# Patient Record
Sex: Female | Born: 1980 | Race: Black or African American | Hispanic: No | Marital: Single | Smoking: Current some day smoker
Health system: Southern US, Community
[De-identification: ages and names within clinical notes are randomized; demographics above are authoritative.]

## PROBLEM LIST (undated history)

## (undated) DIAGNOSIS — I10 Essential (primary) hypertension: Secondary | ICD-10-CM

## (undated) DIAGNOSIS — T7840XA Allergy, unspecified, initial encounter: Secondary | ICD-10-CM

## (undated) DIAGNOSIS — R87619 Unspecified abnormal cytological findings in specimens from cervix uteri: Secondary | ICD-10-CM

## (undated) DIAGNOSIS — K219 Gastro-esophageal reflux disease without esophagitis: Secondary | ICD-10-CM

## (undated) DIAGNOSIS — E7800 Pure hypercholesterolemia, unspecified: Secondary | ICD-10-CM

## (undated) DIAGNOSIS — R7302 Impaired glucose tolerance (oral): Secondary | ICD-10-CM

## (undated) DIAGNOSIS — J449 Chronic obstructive pulmonary disease, unspecified: Secondary | ICD-10-CM

## (undated) DIAGNOSIS — E559 Vitamin D deficiency, unspecified: Secondary | ICD-10-CM

## (undated) DIAGNOSIS — F32A Depression, unspecified: Secondary | ICD-10-CM

## (undated) DIAGNOSIS — E079 Disorder of thyroid, unspecified: Secondary | ICD-10-CM

## (undated) DIAGNOSIS — R7303 Prediabetes: Secondary | ICD-10-CM

## (undated) HISTORY — DX: Chronic obstructive pulmonary disease, unspecified: J44.9

## (undated) HISTORY — DX: Impaired glucose tolerance (oral): R73.02

## (undated) HISTORY — DX: Depression, unspecified: F32.A

## (undated) HISTORY — DX: Vitamin D deficiency, unspecified: E55.9

## (undated) HISTORY — DX: Allergy, unspecified, initial encounter: T78.40XA

## (undated) HISTORY — DX: Essential (primary) hypertension: I10

## (undated) HISTORY — DX: Gastro-esophageal reflux disease without esophagitis: K21.9

---
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
## 2000-04-20 ENCOUNTER — Emergency Department (HOSPITAL_COMMUNITY): Admission: EM | Admit: 2000-04-20 | Discharge: 2000-04-21 | Payer: Self-pay | Admitting: Emergency Medicine

## 2000-05-22 ENCOUNTER — Encounter: Admission: RE | Admit: 2000-05-22 | Discharge: 2000-05-22 | Payer: Self-pay | Admitting: Family Medicine

## 2000-06-09 ENCOUNTER — Emergency Department (HOSPITAL_COMMUNITY): Admission: EM | Admit: 2000-06-09 | Discharge: 2000-06-09 | Payer: Self-pay | Admitting: Emergency Medicine

## 2000-06-09 ENCOUNTER — Encounter: Payer: Self-pay | Admitting: Emergency Medicine

## 2001-12-09 ENCOUNTER — Other Ambulatory Visit: Admission: RE | Admit: 2001-12-09 | Discharge: 2001-12-09 | Payer: Self-pay | Admitting: Gynecology

## 2002-12-08 ENCOUNTER — Other Ambulatory Visit: Admission: RE | Admit: 2002-12-08 | Discharge: 2002-12-08 | Payer: Self-pay | Admitting: Obstetrics and Gynecology

## 2005-10-27 ENCOUNTER — Emergency Department (HOSPITAL_COMMUNITY): Admission: EM | Admit: 2005-10-27 | Discharge: 2005-10-28 | Payer: Self-pay | Admitting: Emergency Medicine

## 2005-11-19 ENCOUNTER — Emergency Department (HOSPITAL_COMMUNITY): Admission: EM | Admit: 2005-11-19 | Discharge: 2005-11-19 | Payer: Self-pay | Admitting: Family Medicine

## 2006-01-11 ENCOUNTER — Emergency Department (HOSPITAL_COMMUNITY): Admission: EM | Admit: 2006-01-11 | Discharge: 2006-01-11 | Payer: Self-pay | Admitting: Emergency Medicine

## 2006-03-18 ENCOUNTER — Emergency Department (HOSPITAL_COMMUNITY): Admission: EM | Admit: 2006-03-18 | Discharge: 2006-03-18 | Payer: Self-pay | Admitting: Emergency Medicine

## 2006-04-26 ENCOUNTER — Emergency Department (HOSPITAL_COMMUNITY): Admission: EM | Admit: 2006-04-26 | Discharge: 2006-04-27 | Payer: Self-pay | Admitting: Emergency Medicine

## 2008-05-03 ENCOUNTER — Emergency Department (HOSPITAL_COMMUNITY): Admission: EM | Admit: 2008-05-03 | Discharge: 2008-05-03 | Payer: Self-pay | Admitting: Emergency Medicine

## 2008-11-27 ENCOUNTER — Emergency Department (HOSPITAL_COMMUNITY): Admission: EM | Admit: 2008-11-27 | Discharge: 2008-11-27 | Payer: Self-pay | Admitting: Emergency Medicine

## 2009-07-07 ENCOUNTER — Emergency Department (HOSPITAL_COMMUNITY): Admission: EM | Admit: 2009-07-07 | Discharge: 2009-07-07 | Payer: Self-pay | Admitting: Family Medicine

## 2009-11-22 ENCOUNTER — Emergency Department (HOSPITAL_COMMUNITY): Admission: EM | Admit: 2009-11-22 | Discharge: 2009-11-22 | Payer: Self-pay | Admitting: Emergency Medicine

## 2010-07-13 LAB — RAPID STREP SCREEN (MED CTR MEBANE ONLY): Streptococcus, Group A Screen (Direct): NEGATIVE

## 2010-07-22 LAB — POCT I-STAT, CHEM 8
BUN: 8 mg/dL (ref 6–23)
Creatinine, Ser: 0.8 mg/dL (ref 0.4–1.2)
Potassium: 3.7 mEq/L (ref 3.5–5.1)
Sodium: 140 mEq/L (ref 135–145)
TCO2: 29 mmol/L (ref 0–100)

## 2010-07-22 LAB — URINALYSIS, ROUTINE W REFLEX MICROSCOPIC
Bilirubin Urine: NEGATIVE
Glucose, UA: NEGATIVE mg/dL
Hgb urine dipstick: NEGATIVE
Ketones, ur: NEGATIVE mg/dL
Nitrite: NEGATIVE
Protein, ur: NEGATIVE mg/dL
Specific Gravity, Urine: 1.014 (ref 1.005–1.030)
Urobilinogen, UA: 0.2 mg/dL (ref 0.0–1.0)
pH: 6 (ref 5.0–8.0)

## 2010-07-22 LAB — DIFFERENTIAL
Basophils Relative: 1 % (ref 0–1)
Eosinophils Absolute: 0.2 10*3/uL (ref 0.0–0.7)
Lymphocytes Relative: 56 % — ABNORMAL HIGH (ref 12–46)
Neutro Abs: 0.8 10*3/uL — ABNORMAL LOW (ref 1.7–7.7)

## 2010-07-22 LAB — CBC
HCT: 40.7 % (ref 36.0–46.0)
Hemoglobin: 13.6 g/dL (ref 12.0–15.0)
MCHC: 33.5 g/dL (ref 30.0–36.0)
RBC: 4.9 MIL/uL (ref 3.87–5.11)

## 2011-08-19 ENCOUNTER — Encounter (HOSPITAL_COMMUNITY): Payer: Self-pay | Admitting: *Deleted

## 2011-08-19 ENCOUNTER — Emergency Department (HOSPITAL_COMMUNITY)
Admission: EM | Admit: 2011-08-19 | Discharge: 2011-08-19 | Disposition: A | Discharge status: Home / Self Care | Payer: Self-pay | Attending: Emergency Medicine | Admitting: Emergency Medicine

## 2011-08-19 DIAGNOSIS — R109 Unspecified abdominal pain: Secondary | ICD-10-CM | POA: Insufficient documentation

## 2011-08-19 DIAGNOSIS — R197 Diarrhea, unspecified: Secondary | ICD-10-CM | POA: Insufficient documentation

## 2011-08-19 DIAGNOSIS — R110 Nausea: Secondary | ICD-10-CM | POA: Insufficient documentation

## 2011-08-19 HISTORY — DX: Unspecified abnormal cytological findings in specimens from cervix uteri: R87.619

## 2011-08-19 MED ORDER — ONDANSETRON HCL 4 MG/2ML IJ SOLN
4.0000 mg | Freq: Once | INTRAMUSCULAR | Status: AC
  Administered 2011-08-19: 4 mg via INTRAVENOUS
  Filled 2011-08-19: qty 2

## 2011-08-19 MED ORDER — SODIUM CHLORIDE 0.9 % IV BOLUS (SEPSIS)
1000.0000 mL | Freq: Once | INTRAVENOUS | Status: AC
  Administered 2011-08-19: 1000 mL via INTRAVENOUS

## 2011-08-19 NOTE — ED Provider Notes (Signed)
I saw and evaluated the patient, reviewed the resident's note and I agree with the findings and plan. C/o intermittent epigastric cramps with n and diarrhea. No vomiting. sxs last only a few  Minutes.  Friend has same sxs. asx now.  Mild epigastric ttp. No  Peritoneal signs. Will give ivf and release. No indication for tests.  Cheri Guppy, MD 08/19/11 1729

## 2011-08-19 NOTE — ED Provider Notes (Signed)
History     CSN: [id number]  Arrival date & time 08/19/11  1519   None     Chief Complaint  Patient presents with  . Abdominal Pain    (Consider location/radiation/quality/duration/timing/severity/associated sxs/prior treatment) HPI Comments: Otherwise doing well.  No period x 7 months (normal for pt).  Friend with diarrhea last week - thinks she caught a virus from her.  Patient is a 31 y.o. female presenting with abdominal pain. The history is provided by the patient.  Abdominal Pain The primary symptoms of the illness include abdominal pain (upper abdominal cramping), nausea and diarrhea (8 episodes/day x 1 week  watery brown). The primary symptoms of the illness do not include fever, shortness of breath, vomiting, dysuria, vaginal discharge or vaginal bleeding. Episode onset: 7 days. The onset of the illness was gradual. The problem has not changed since onset. Symptoms associated with the illness do not include urgency, frequency or back pain.    Past Medical History  Diagnosis Date  . Pap smear abnormality of cervix     No past surgical history on file.  No family history on file.  History  Substance Use Topics  . Smoking status: Current Some Day Smoker  . Smokeless tobacco: Not on file  . Alcohol Use:     OB History    Grav Para Term Preterm Abortions TAB SAB Ect Mult Living                  Review of Systems  Constitutional: Negative for fever and activity change.  HENT: Negative for congestion.   Eyes: Negative for visual disturbance.  Respiratory: Negative for chest tightness and shortness of breath.   Cardiovascular: Negative for chest pain and leg swelling.  Gastrointestinal: Positive for nausea, abdominal pain (upper abdominal cramping) and diarrhea (8 episodes/day x 1 week  watery brown). Negative for vomiting.  Genitourinary: Negative for dysuria, urgency, frequency, vaginal bleeding and vaginal discharge.  Musculoskeletal: Negative for back pain.    Skin: Negative for rash.  Neurological: Negative for syncope.  Psychiatric/Behavioral: Negative for behavioral problems.    Allergies  Codeine and Hydrocodone  Home Medications   Current Outpatient Rx  Name Route Sig Dispense Refill  . LORATADINE-PSEUDOEPHEDRINE ER 10-240 MG PO TB24 Oral Take 1 tablet by mouth daily.      BP 108/68  Pulse 78  Temp(Src) 98.3 F (36.8 C) (Oral)  Resp 18  SpO2 99%  Physical Exam  Constitutional: She is oriented to person, place, and time. She appears well-developed and well-nourished.  HENT:  Head: Normocephalic and atraumatic.  Eyes: Conjunctivae and EOM are normal. Pupils are equal, round, and reactive to light. No scleral icterus.  Neck: Normal range of motion. Neck supple.  Cardiovascular: Normal rate and regular rhythm.  Exam reveals no gallop and no friction rub.   No murmur heard. Pulmonary/Chest: Effort normal and breath sounds normal. No respiratory distress. She has no wheezes. She has no rales. She exhibits no tenderness.  Abdominal: Soft. She exhibits no distension and no mass. There is tenderness (mild upper abdominal ttp). There is no rebound and no guarding.  Musculoskeletal: Normal range of motion.  Neurological: She is alert and oriented to person, place, and time. She has normal reflexes. No cranial nerve deficit.  Skin: Skin is warm and dry. No rash noted.  Psychiatric: She has a normal mood and affect. Her behavior is normal. Judgment and thought content normal.    ED Course  Procedures (including critical care time)  Labs Reviewed  POCT PREGNANCY, URINE   No results found.   1. Diarrhea   2. Nausea       MDM  Diarrhea x 1 week.  Otherwise doing well.  No period x 7 months (normal for pt).  Friend with diarrhea last week - thinks she caught a virus from her.  VSS and well appearing.  No S/S of dehydration.  Gave dose of zofran, NS bolus.  Pt asymptomatic in ED.  Doubt acute intraabdominal pathology.  Likely  viral diarrhea.  Gave info to establish PCP.  Pt comfortable with plan and will follow up.         Army Chaco, MD 08/19/11 1701

## 2011-08-19 NOTE — Discharge Instructions (Signed)
RESOURCE GUIDE  Dental Problems  Patients with Medicaid: [facility]                     [address]                                           Phone:  [phone]                                                  If unable to pay or uninsured, contact:  [facility] or [facility] to become qualified for the adult dental clinic.  Chronic Pain Problems Contact [facility]  [phone] Patients need to be referred by their primary care doctor.  Insufficient Money for Medicine Contact [employer]:  call "211" or [facility] 271-5999.  No Primary Care Doctor Call [facility]  832-8000 Other agencies that provide inexpensive medical care    [facility]  832-8035    [facility]  832-7272    [facility]  [phone]    [facility]  832-4777    [facility]  [phone]    [facility]  [phone]  Substance Abuse Resources [facility]  336-882-2125 [facility] 336-784-9470 [facility] 336-285-9073 [facility] 336-845-3988 Residential & Outpatient Substance Abuse Program  800-659-3381  Psychological Services [facility]  832-9600 [facility]  [phone] [facility]   800 853-5163 (emergency services 641-4993)  Abuse/Neglect [place] Child [facility] Hotline (336) 641-3795 [place] [facility] Hotline 800-378-5315 (After Hours)  Emergency Shelter [facility] (336) 271-5985  [facility] (336) 275-9566 [facility] (704) 372-4663  MRSA Hotline #:   832-7006    [place] Resources  [facility] [facility]  [employer]                           [facility] [address] [place]                     [address]         [address]  [place]                                                [place]                              [place] Phone:  [phone]                                  Phone:  [phone]                   Phone:  [phone]  [facility] Phone:  342-8316  [place] Child Abuse Hotline (336) 342-1394 (336)   612-443-6666 (After Hours)Diarrhea Infections caused by germs (bacterial) or a virus commonly cause diarrhea. Your caregiver has determined that with time, rest and fluids, the diarrhea should improve. In general, eat normally while drinking more water than usual. Although water may prevent dehydration, it does not contain salt and minerals (electrolytes). Broths, weak tea without caffeine and oral rehydration solutions (ORS) replace fluids and electrolytes. Small amounts of fluids should be taken frequently. Large amounts at one time may not be tolerated. Plain water may be harmful in infants and the elderly. Oral rehydrating solutions (ORS) are available at pharmacies and grocery stores. ORS replace water and important electrolytes in proper proportions. Sports drinks are not as effective as ORS and may be harmful due to sugars worsening diarrhea.  ORS is especially recommended for use in children with diarrhea. As a general guideline for children, replace any new fluid losses from diarrhea and/or vomiting with ORS as follows:   If your child weighs 22 pounds or under (10 kg or less), give 60-120 mL ( -  cup or 2 - 4 ounces) of ORS for each episode of diarrheal stool or vomiting episode.   If your child weighs more than 22 pounds (more than 10 kgs), give 120-240 mL ( - 1 cup or 4 - 8 ounces) of ORS for each diarrheal stool or episode of vomiting.   While correcting for dehydration, children should eat normally. However, foods high in sugar should be avoided because this may worsen diarrhea. Large amounts of carbonated soft drinks, juice, gelatin desserts and other highly sugared drinks should be avoided.   After  correction of dehydration, other liquids that are appealing to the child may be added. Children should drink small amounts of fluids frequently and fluids should be increased as tolerated. Children should drink enough fluids to keep urine clear or pale yellow.   Adults should eat normally while drinking more fluids than usual. Drink small amounts of fluids frequently and increase as tolerated. Drink enough fluids to keep urine clear or pale yellow. Broths, weak decaffeinated tea, lemon lime soft drinks (allowed to go flat) and ORS replace fluids and electrolytes.   Avoid:   Carbonated drinks.   Juice.   Extremely hot or cold fluids.   Caffeine drinks.   Fatty, greasy foods.   Alcohol.   Tobacco.   Too much intake of anything at one time.   Gelatin desserts.   Probiotics are active cultures of beneficial bacteria. They may lessen the amount and number of diarrheal stools in adults. Probiotics can be found in yogurt with active cultures and in supplements.   Wash hands well to avoid spreading bacteria and virus.   Anti-diarrheal medications are not recommended for infants and children.   Only take over-the-counter or prescription medicines for pain, discomfort or fever as directed by your caregiver. Do not give aspirin to children because it may cause Reye's Syndrome.   For adults, ask your caregiver if you should continue all prescribed and over-the-counter medicines.   If your caregiver has given you a follow-up appointment, it is very important to keep that appointment. Not keeping the appointment could result in a chronic or permanent injury, and disability. If there is any problem keeping the appointment, you must call back to this facility for assistance.  SEEK IMMEDIATE MEDICAL CARE IF:   You or your child is unable to keep fluids down or other symptoms or problems become worse in spite of treatment.   Vomiting or diarrhea develops and  becomes persistent.   There is  vomiting of blood or bile (green material).   There is blood in the stool or the stools are black and tarry.   There is no urine output in 6-8 hours or there is only a small amount of very dark urine.   Abdominal pain develops, increases or localizes.   You have a fever.   Your baby is older than 3 months with a rectal temperature of 102 F (38.9 C) or higher.   Your baby is 38 months old or younger with a rectal temperature of 100.4 F (38 C) or higher.   You or your child develops excessive weakness, dizziness, fainting or extreme thirst.   You or your child develops a rash, stiff neck, severe headache or become irritable or sleepy and difficult to awaken.  MAKE SURE YOU:   Understand these instructions.   Will watch your condition.   Will get help right away if you are not doing well or get worse.  Document Released: 03/14/2002 Document Revised: 03/13/2011 Document Reviewed: 01/29/2009 [facility] Patient Information 2012 [place], [place].

## 2011-08-19 NOTE — ED Notes (Signed)
Onset: 08/16/11; no vomiting, having diarrhea - lt. Manson Passey. No otc. Exp. Cramps.

## 2011-08-19 NOTE — ED Provider Notes (Signed)
I saw and evaluated the patient, reviewed the resident's note and I agree with the findings and plan.  Cheri Guppy, MD 08/19/11 [phone]

## 2011-08-19 NOTE — ED Notes (Signed)
NAD noted at time of d/c home. D/C inst given by MD, pt verbalized understanding.

## 2013-05-16 ENCOUNTER — Encounter (HOSPITAL_COMMUNITY): Payer: Self-pay | Admitting: Emergency Medicine

## 2013-05-16 ENCOUNTER — Emergency Department (HOSPITAL_COMMUNITY)
Admission: EM | Admit: 2013-05-16 | Discharge: 2013-05-16 | Disposition: A | Discharge status: Home / Self Care | Payer: Self-pay | Attending: Emergency Medicine | Admitting: Emergency Medicine

## 2013-05-16 ENCOUNTER — Emergency Department (HOSPITAL_COMMUNITY): Payer: Self-pay

## 2013-05-16 DIAGNOSIS — F17200 Nicotine dependence, unspecified, uncomplicated: Secondary | ICD-10-CM | POA: Insufficient documentation

## 2013-05-16 DIAGNOSIS — R6889 Other general symptoms and signs: Secondary | ICD-10-CM

## 2013-05-16 DIAGNOSIS — J069 Acute upper respiratory infection, unspecified: Secondary | ICD-10-CM | POA: Insufficient documentation

## 2013-05-16 DIAGNOSIS — J111 Influenza due to unidentified influenza virus with other respiratory manifestations: Secondary | ICD-10-CM | POA: Insufficient documentation

## 2013-05-16 DIAGNOSIS — Z3202 Encounter for pregnancy test, result negative: Secondary | ICD-10-CM | POA: Insufficient documentation

## 2013-05-16 LAB — URINALYSIS, ROUTINE W REFLEX MICROSCOPIC
Bilirubin Urine: NEGATIVE
GLUCOSE, UA: NEGATIVE mg/dL
KETONES UR: NEGATIVE mg/dL
LEUKOCYTES UA: NEGATIVE
Nitrite: NEGATIVE
PH: 5 (ref 5.0–8.0)
Protein, ur: NEGATIVE mg/dL
Specific Gravity, Urine: 1.026 (ref 1.005–1.030)
Urobilinogen, UA: 0.2 mg/dL (ref 0.0–1.0)

## 2013-05-16 LAB — CBC WITH DIFFERENTIAL/PLATELET
BASOS ABS: 0 10*3/uL (ref 0.0–0.1)
BASOS PCT: 0 % (ref 0–1)
EOS ABS: 0.2 10*3/uL (ref 0.0–0.7)
Eosinophils Relative: 3 % (ref 0–5)
HCT: 42.6 % (ref 36.0–46.0)
HEMOGLOBIN: 14.4 g/dL (ref 12.0–15.0)
Lymphocytes Relative: 38 % (ref 12–46)
Lymphs Abs: 2 10*3/uL (ref 0.7–4.0)
MCH: 28.7 pg (ref 26.0–34.0)
MCHC: 33.8 g/dL (ref 30.0–36.0)
MCV: 84.9 fL (ref 78.0–100.0)
Monocytes Absolute: 0.9 10*3/uL (ref 0.1–1.0)
Monocytes Relative: 16 % — ABNORMAL HIGH (ref 3–12)
NEUTROS ABS: 2.3 10*3/uL (ref 1.7–7.7)
NEUTROS PCT: 43 % (ref 43–77)
PLATELETS: 249 10*3/uL (ref 150–400)
RBC: 5.02 MIL/uL (ref 3.87–5.11)
RDW: 13.1 % (ref 11.5–15.5)
WBC: 5.3 10*3/uL (ref 4.0–10.5)

## 2013-05-16 LAB — URINE MICROSCOPIC-ADD ON

## 2013-05-16 LAB — COMPREHENSIVE METABOLIC PANEL
ALBUMIN: 3.8 g/dL (ref 3.5–5.2)
ALK PHOS: 70 U/L (ref 39–117)
ALT: 17 U/L (ref 0–35)
AST: 21 U/L (ref 0–37)
BUN: 12 mg/dL (ref 6–23)
CHLORIDE: 99 meq/L (ref 96–112)
CO2: 24 mEq/L (ref 19–32)
Calcium: 9.3 mg/dL (ref 8.4–10.5)
Creatinine, Ser: 0.74 mg/dL (ref 0.50–1.10)
GFR calc Af Amer: >90 mL/min (ref >90)
GFR calc non Af Amer: >90 mL/min (ref >90)
Glucose, Bld: 93 mg/dL (ref 70–99)
POTASSIUM: 4.1 meq/L (ref 3.7–5.3)
SODIUM: 139 meq/L (ref 137–147)
TOTAL PROTEIN: 8.1 g/dL (ref 6.0–8.3)

## 2013-05-16 LAB — POCT PREGNANCY, URINE: PREG TEST UR: NEGATIVE

## 2013-05-16 MED ORDER — ALBUTEROL SULFATE HFA 108 (90 BASE) MCG/ACT IN AERS
4.0000 | INHALATION_SPRAY | Freq: Once | RESPIRATORY_TRACT | Status: AC
  Administered 2013-05-16: 4 via RESPIRATORY_TRACT
  Filled 2013-05-16: qty 6.7

## 2013-05-16 MED ORDER — IBUPROFEN 600 MG PO TABS: 600.0000 mg | ORAL_TABLET | Freq: Four times a day (QID) | ORAL | Status: DC | PRN

## 2013-05-16 MED ORDER — ACETAMINOPHEN 500 MG PO TABS
1000.0000 mg | ORAL_TABLET | Freq: Once | ORAL | Status: AC
  Administered 2013-05-16: 1000 mg via ORAL
  Filled 2013-05-16: qty 2

## 2013-05-16 MED ORDER — IBUPROFEN 800 MG PO TABS: 800.0000 mg | ORAL_TABLET | Freq: Once | ORAL | Status: DC

## 2013-05-16 NOTE — Discharge Instructions (Signed)
Follow up with a primary care doctor in 1 week. Resource guide provided for follow up. Use inhaler as directed. Return to ED should you develop worsening symptoms.    Emergency Department Resource Guide 1) Find a Doctor and Pay Out of Pocket Although you won't have to find out who is covered by your insurance plan, it is a good idea to ask around and get recommendations. You will then need to call the office and see if the doctor you have chosen will accept you as a new patient and what types of options they offer for patients who are self-pay. Some doctors offer discounts or will set up payment plans for their patients who do not have insurance, but you will need to ask so you aren't surprised when you get to your appointment.  2) Contact Your Local Health Department Not all health departments have doctors that can see patients for sick visits, but many do, so it is worth a call to see if yours does. If you don't know where your local health department is, you can check in your phone book. The CDC also has a tool to help you locate your state's health department, and many state websites also have listings of all of their local health departments.  3) Find a Walk-in Clinic If your illness is not likely to be very severe or complicated, you may want to try a walk in clinic. These are popping up all over the country in pharmacies, drugstores, and shopping centers. They're usually staffed by nurse practitioners or physician assistants that have been trained to treat common illnesses and complaints. They're usually fairly quick and inexpensive. However, if you have serious medical issues or chronic medical problems, these are probably not your best option.  No Primary Care Doctor: - Call Health Connect at  [phone] - they can help you locate a primary care doctor that  accepts your insurance, provides certain services, etc. - Physician Referral Service- [phone]  Chronic Pain  Problems: Organization         Address  Phone   Notes  [facility]  [phone] Patients need to be referred by their primary care doctor.   Medication Assistance: Organization         Address  Phone   Notes  [facility] [address] [place], [place] [address] [phone] --Must be a resident of [facility] -- Must have NO insurance coverage whatsoever (no Medicaid/ Medicare, etc.) -- The pt. MUST have a primary care doctor that directs their care regularly and follows them in the community   MedAssist  [phone]   [employer]  [phone]    Agencies that provide inexpensive medical care: Organization         Address  Phone   Notes  [facility]  [phone]   [facility]    [phone]   [facility] [address] [place], [place] [address] [phone]   [facility] [address], [place] [phone]   [facility]anned [facility]    [phone]   [facility]    [phone]   [facility]  [address], [place] Phone:  [phone], Fax:  [phone] Hours of Operation:  9 am - 6 pm, M-F.  Also accepts Medicaid/Medicare and self-pay.  [facility]  [address]  [place], [place], [place] Phone: [phone], Fax: [phone]. Hours of Operation:  8:30 am - 5:30 pm, M-F.  Also accepts Medicaid and self-pay.  [facility] [place] [address], [place] Phone: [phone]   [place], [place], [place] [phone], Ext. [phone] Mondays & Thursdays: 7-9 AM.  First 15 patients are seen on a first come, first serve basis.    [place] Providers:  Organization         Address  Phone   Notes  [facility] [address], [place] [phone] Also  accepts self-pay patients.  [facility] [address], [place]  [phone]   [place], [address], [place] [phone]   [facility] [address] [place] [phone]   Lucianne Lei [address], [place]   [phone] Only accepts [place] Access [place] patients after they have their name applied to their card.   Self-Pay (no insurance) in [facility]:  Organization         Address  Phone   Notes  Sickle Cell Patients, [facility] [place] [phone]   [facility] [place] [phone]   [facility] [place]  [place], [place],  [phone]   [facility]/Dr. Osei-Bonsu  [address] [place] or [address], [place] [phone] Phone number for both [place] and [place] locations is the same.  [facility] [address], [place] [phone]   [facility] [address] [place] or [address] [phone]   [facility] [address], [place] [phone], phone; [phone], fax Sees patients 1st and 3rd Saturday of every month.  Must not qualify for public or private insurance (i.e. Medicaid, Medicare, [place] Health Choice, Veterans' Benefits)  Household income should be no more than 200% of the poverty level The clinic cannot treat you if you are pregnant or think you are pregnant  Sexually transmitted diseases are not treated at the clinic.    Dental Care: Organization         Address  Phone  Notes  [facility] [place] [phone] Accepts children up to age 34 who are enrolled in [place] or [place]; pregnant  women with a Medicaid card; and children who have applied for Medicaid or [place] Health Choice, but were declined, whose parents can pay a reduced fee at time of service.  [facility]  [address], [place] [phone] Accepts children up to age 24 who are enrolled in [place] or [place]; pregnant women with a Medicaid card; and children who have applied for Medicaid or [place] Health Choice, but were declined, whose parents can pay a reduced fee at time of service.  [facility]  [place] [phone] Patients are seen by appointment only. Walk-ins are not accepted. [place] will see patients 57 years of age and older. Monday - Tuesday (8am-5pm) Most Wednesdays (8:30-5pm) $30 per visit, cash only  [place]  [address], [place] [phone] Patients are seen by appointment only. Walk-ins are not accepted. [facility] will see patients 18 years of age and older. °One Wednesday Evening (Monthly: Volunteer Based).  $30 per visit, cash only  °[facility]  [phone] for adults; Children under age 4, call Graduate Pediatric Dentistry at [phone]. Children aged 4-14, please call [phone] to request a pediatric application. ° Dental services are provided in all areas of dental care including fillings, crowns and bridges, complete and partial dentures, implants, gum treatment, root canals, and extractions. Preventive care is also provided. Treatment is provided to both adults and children. °Patients are selected via a lottery and there is often a waiting list. °  °[facility] [address], °[place] ° [phone] [email or web address].com °  °[facility] [address], [place], [place] [phone], Ext. [phone] Second and Fourth Thursday of each month, opens at 6:30 AM; Clinic ends at 9 AM.  Patients are  seen on a first-come first-served basis, and a limited number are seen during each clinic.  ° °[facility] ° [address], [place], [place] [phone]   Eligibility Requirements °You must have lived in [place], [place], or [place] counties for at least the last three months. °  You cannot be eligible for state or federal sponsored healthcare insurance, including Veterans Administration, Medicaid, or Medicare. °  You generally cannot be eligible for healthcare insurance through your employer.  °  How to apply: °Eligibility screenings are held every Tuesday and Wednesday afternoon from 1:00 pm until 4:00 pm. You do not need an appointment for the interview!  °[facility] [address], [place], [place] [phone]   °[facility]  [phone]   °[facility]  [phone]   °[facility]  336-570-6415   ° °Behavioral Health Resources in the Community: °Intensive Outpatient Programs °Organization         Address  Phone  Notes  °[facility] [address], [place], [place] [phone]   °[facility] [address], [place], [place] [phone]   °[facility] [address], [place], [place] ° 336-882-2125   °[facility] [address],  °[place], [place] 1-800-853-5163 or 336-641-4981   °Substance Abuse Resources °Organization         Address  Phone  Notes  °[facility]  336-882-2125   °[facility]  336-784-9470   °[facility]  336-285-9073   °[facility]  336-845-3988   °Residential & Outpatient Substance Abuse Program  1-800-659-3381   °Psychological Services °Organization         Address  Phone  Notes  ° Health  336- [phone]   °[facility]  336- 378-7881   °[facility] [address], [place] 1-800-853-5163 or 336-641-4981   ° °Mobile Crisis  Teams °Organization         Address  Phone  Notes  °[facility], Mobile Crisis Care Unit  1-877-626-1772   °Assertive °Psychotherapeutic Services ° [address] [place], [place] 336-834-9664   °Sharon DeEsch [address] °[place] 336-554-5454   ° °Self-Help/Support Groups °Organization         Address  Phone             Notes  °[facility] [place] -   variety of support groups  336- 336-389-6044 Call for more information  Narcotics Anonymous (NA), [facility] [address], [employer] [place]  2 meetings at this location   Residential Facilities manager         Address  Phone  Notes  [facility] [place],    [place]  1-463-457-0038   [facility]  [address] [place] [address], [place], [place]   [place], [place] [phone] Admissions: 8am-3pm M-F  [facility] [place] [address],    [place], [place] [phone]   [facility] [address] [place], [place], [place]   [facility] [address]  [place], [place]   [facility] - Intensive Outpatient [address], [place], [place]   [facility] [place] [address]  [place], [place] 1-640-386-3300 or 325-254-7062   [facility] ([facility]) [address] [place], [place] Accepts Medicaid  [facility] [place] [address],  [place] 1-646-193-9807 Substance Abuse/Addiction Treatment   [facility] Organization         Address  Phone  Notes  [facility]  [phone]   Domenic Schwab, PhD [address] [place], [place]   (925) [phone] or ([phone]) [phone]   [facility] [place], [place] [phone]   [facility] [address] [place], [place] [phone]  Insurance/Medicaid/sponsorship through [facility] and Families [address], [address]                                    [place], [place] [phone] [place] [address], [place] [phone]    Dr. Adele Schilder  [phone]   [facility] Dept. 1) [address] [place] 2) [place] 3)  [place] [address], [place] [phone]  [phone]   [place] [phone] or [phone] (After Hours)

## 2013-05-16 NOTE — ED Provider Notes (Signed)
CSN: [id number]     Arrival date & time 05/16/13  4098 History   First MD Initiated Contact with Patient 05/16/13 1601     Chief Complaint  Patient presents with  . Generalized Body Aches  . Chills     (Consider location/radiation/quality/duration/timing/severity/associated sxs/prior Treatment) HPI 33 yo female presents with body aches, nausea, sneezing, diarrhea x 4, cough, chills, HA, weakness and fatigue that all started yesterday. Patient works as a Lawyer at [employer].  Patient admits to flu vaccine this year. Patient admits to sick contacts at work. Denies sore throat, Vomiting, bloody stools, CP, or SOB. Patient has tried benadryl with little relief.  Admits to a hx of bronchitis in the past that she had used an inhaler for but ran out.   Patient social smoker/drinker x 1 year.   Past Medical History  Diagnosis Date  . Pap smear abnormality of cervix    History reviewed. No pertinent past surgical history. No family history on file. History  Substance Use Topics  . Smoking status: Current Some Day Smoker  . Smokeless tobacco: Not on file  . Alcohol Use: Yes     Comment: occ   OB History   Grav Para Term Preterm Abortions TAB SAB Ect Mult Living                 Review of Systems  All other systems reviewed and are negative.      Allergies  Codeine; Hydrocodone; Tomato; and Pineapple  Home Medications   Current Outpatient Rx  Name  Route  Sig  Dispense  Refill  . diphenhydrAMINE (BENADRYL) 25 MG tablet   Oral   Take 25 mg by mouth every 6 (six) hours as needed for allergies.         Marland Kitchen ibuprofen (ADVIL,MOTRIN) 600 MG tablet   Oral   Take 1 tablet (600 mg total) by mouth every 6 (six) hours as needed.   30 tablet   0    BP 111/73  Pulse 83  Temp(Src) 98.3 F (36.8 C) (Oral)  Resp 18  SpO2 100%  LMP 03/21/2013 Physical Exam  Nursing note and vitals reviewed. Constitutional: She is oriented to person, place, and time. She appears well-developed  and well-nourished. No distress.  HENT:  Head: Normocephalic and atraumatic.  Right Ear: Tympanic membrane and ear canal normal.  Left Ear: Tympanic membrane and ear canal normal.  Nose: Nose normal. Right sinus exhibits no maxillary sinus tenderness and no frontal sinus tenderness. Left sinus exhibits no maxillary sinus tenderness and no frontal sinus tenderness.  Mouth/Throat: Uvula is midline, oropharynx is clear and moist and mucous membranes are normal. No oropharyngeal exudate, posterior oropharyngeal edema or posterior oropharyngeal erythema.  Eyes: Conjunctivae and EOM are normal. Right eye exhibits no discharge. Left eye exhibits no discharge. No scleral icterus.  Neck: Normal range of motion and phonation normal. Neck supple. No JVD present. No rigidity. No tracheal deviation, no edema and no erythema present.  Cardiovascular: Normal rate and regular rhythm.  Exam reveals no gallop and no friction rub.   No murmur heard. Pulmonary/Chest: Effort normal and breath sounds normal. No stridor. No respiratory distress. She has no wheezes. She has no rales.  Musculoskeletal: Normal range of motion. She exhibits no edema.  Lymphadenopathy:    She has no cervical adenopathy.  Neurological: She is alert and oriented to person, place, and time.  Skin: Skin is warm and dry. She is not diaphoretic.  Psychiatric: She has a  normal mood and affect. Her behavior is normal.    ED Course  Procedures (including critical care time) Labs Review Labs Reviewed  CBC WITH DIFFERENTIAL - Abnormal; Notable for the following:    Monocytes Relative 16 (*)    All other components within normal limits  COMPREHENSIVE METABOLIC PANEL - Abnormal; Notable for the following:    Total Bilirubin <0.2 (*)    All other components within normal limits  URINALYSIS, ROUTINE W REFLEX MICROSCOPIC - Abnormal; Notable for the following:    Hgb urine dipstick SMALL (*)    All other components within normal limits  URINE  MICROSCOPIC-ADD ON - Abnormal; Notable for the following:    Squamous Epithelial / LPF FEW (*)    Bacteria, UA FEW (*)    All other components within normal limits  POCT PREGNANCY, URINE   Imaging Review Dg Chest 2 View  05/16/2013   CLINICAL DATA:  Cough and shortness of breath  EXAM: CHEST  2 VIEW  COMPARISON:  None.  FINDINGS: Lungs are clear. Heart size and pulmonary vascularity are normal. No adenopathy. No bone lesions. 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  IMPRESSION: No edema or consolidation.  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   Electronically Signed   By: Bretta BangWilliam  Woodruff M.D.   On: 05/16/2013 16:59    EKG Interpretation   None       MDM   Final diagnoses:  Flu-like symptoms  Acute URI    Patient afebrile with normal VS.  No leukocytosis CXR shows no acute abnormalities.  UA shows small hgb, though appears to be contaminated. Patient states she thinks she may be starting her menstrual cycle.  Patient sxs improved with tx in ED. Patient tolerating oral fluids.  Plan to discharge patient home with MDI albuterol and have patient follow up in 1 wk with PCP. Return precautions given.   Meds given in ED:  Medications  acetaminophen (TYLENOL) tablet 1,000 mg (1,000 mg Oral Given 05/16/13 1725)  albuterol (PROVENTIL HFA;VENTOLIN HFA) 108 (90 BASE) MCG/ACT inhaler 4 puff (4 puffs Inhalation Given 05/16/13 1734)    Discharge Medication List as of 05/16/2013  5:32 PM    START taking these medications   Details  ibuprofen (ADVIL,MOTRIN) 600 MG tablet Take 1 tablet (600 mg total) by mouth every 6 (six) hours as needed., Starting 05/16/2013, Until Discontinued, Print                Rudene AndaJacob Gray Rogers Ditter, PA-C 05/17/13 1459

## 2013-05-16 NOTE — ED Notes (Signed)
Pt is here with body aches, diarrhea, chills, nausea, and headache.  Pt reports some back and neck pain

## 2013-05-17 NOTE — ED Provider Notes (Signed)
Medical screening examination/treatment/procedure(s) were performed by non-physician practitioner and as supervising physician I was immediately available for consultation/collaboration.  EKG Interpretation   None         Gwyneth SproutWhitney Taten Merrow, MD 05/17/13 1516

## 2013-08-12 ENCOUNTER — Ambulatory Visit (INDEPENDENT_AMBULATORY_CARE_PROVIDER_SITE_OTHER): Payer: BC Managed Care – PPO | Admitting: Emergency Medicine

## 2013-08-12 VITALS — BP 134/90 | HR 77 | Temp 98.2°F | Resp 16 | Ht 64.0 in | Wt 187.0 lb

## 2013-08-12 DIAGNOSIS — S335XXA Sprain of ligaments of lumbar spine, initial encounter: Secondary | ICD-10-CM

## 2013-08-12 MED ORDER — CYCLOBENZAPRINE HCL 5 MG PO TABS: 5.0000 mg | ORAL_TABLET | Freq: Three times a day (TID) | ORAL | Status: DC | PRN

## 2013-08-12 MED ORDER — TRAMADOL HCL 50 MG PO TABS: 50.0000 mg | ORAL_TABLET | Freq: Three times a day (TID) | ORAL | Status: DC | PRN

## 2013-08-12 MED ORDER — NAPROXEN SODIUM 550 MG PO TABS: 550.0000 mg | ORAL_TABLET | Freq: Two times a day (BID) | ORAL | Status: DC

## 2013-08-12 NOTE — Progress Notes (Signed)
[facility] [address], [place] [address] [phone]  Date:  08/12/2013   Name:  Breanna CobbSheree Valenzuela   DOB:  May 14, 1980   MRN:  [id number]  PCP:  No PCP Per Patient    Chief Complaint: Back Pain   History of Present Illness:  Breanna Valenzuela is a 33 y.o. very pleasant female patient who presents with the following:  Three day history of low back pain.  No history of injury.  No radiation of pain, weakness  Or neuro symptoms.  Pain worse with standing, walking or bending.  Can't lay on back flat.  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  Prior back pain in past.  No improvement with over the counter medications or other home remedies. Denies other complaint or health concern today.   There are no active problems to display for this patient.   Past Medical History  Diagnosis Date  . Pap smear abnormality of cervix   . Allergy     History reviewed. No pertinent past surgical history.  History  Substance Use Topics  . Smoking status: Current Some Day Smoker  . Smokeless tobacco: Not on file  . Alcohol Use: Yes     Comment: occ    Family History  Problem Relation Age of Onset  . Diabetes Mother   . Hyperlipidemia Mother   . Mental illness Brother   . Hyperlipidemia Brother   . Mental illness Brother     Allergies  Allergen Reactions  . Codeine Other (See Comments)    Pt gets delirious.  . Hydrocodone Other (See Comments)    Pt gets delirious.  . Tomato Itching, Swelling and Rash  . Pineapple Itching, Swelling and Rash    Medication list has been reviewed and updated.  Current Outpatient Prescriptions on File Prior to Visit  Medication Sig Dispense Refill  . diphenhydrAMINE (BENADRYL) 25 MG tablet Take 25 mg by mouth every 6 (six) hours as needed for allergies.      Marland Kitchen. ibuprofen (ADVIL,MOTRIN) 600 MG tablet Take 1 tablet (600 mg total) by mouth every 6 (six) hours as needed.  30 tablet  0   No current facility-administered medications on file prior to  visit.    Review of Systems:  As per HPI, otherwise negative.    Physical Examination: Filed Vitals:   08/12/13 0944  BP: 134/90  Pulse: 77  Temp: 98.2 F (36.8 C)  Resp: 16   Filed Vitals:   08/12/13 0944  Height: 5\' 4"  (1.626 m)  Weight: 187 lb (84.823 kg)   Body mass index is 32.08 kg/(m^2). Ideal Body Weight: Weight in (lb) to have BMI = 25: 145.3   GEN: WDWN, NAD, Non-toxic, Alert & Oriented x 3 HEENT: Atraumatic, Normocephalic.  Ears and Nose: No external deformity. EXTR: No clubbing/cyanosis/edema NEURO: Normal gait.  PSYCH: Normally interactive. Conversant. Not depressed or anxious appearing.  Calm demeanor.  BACK;  Tender lumbar spinous tenderness.  Neuro and motor intact  Assessment and Plan: Lumbar strain Anaprox Flexeril Ultram  Signed,  Phillips OdorJeffery Anderson, MD

## 2013-08-12 NOTE — Patient Instructions (Signed)
Back Pain, Adult Low back pain is very common. About 1 in 5 people have back pain.The cause of low back pain is rarely dangerous. The pain often gets better over time.About half of people with a sudden onset of back pain feel better in just 2 weeks. About 8 in 10 people feel better by 6 weeks.  CAUSES Some common causes of back pain include:  Strain of the muscles or ligaments supporting the spine.  Wear and tear (degeneration) of the spinal discs.  Arthritis.  Direct injury to the back. DIAGNOSIS Most of the time, the direct cause of low back pain is not known.However, back pain can be treated effectively even when the exact cause of the pain is unknown.Answering your caregiver's questions about your overall health and symptoms is one of the most accurate ways to make sure the cause of your pain is not dangerous. If your caregiver needs more information, he or she may order lab work or imaging tests (X-rays or MRIs).However, even if imaging tests show changes in your back, this usually does not require surgery. HOME CARE INSTRUCTIONS For many people, back pain returns.Since low back pain is rarely dangerous, it is often a condition that people can learn to manageon their own.   Remain active. It is stressful on the back to sit or stand in one place. Do not sit, drive, or stand in one place for more than 30 minutes at a time. Take short walks on level surfaces as soon as pain allows.Try to increase the length of time you walk each day.  Do not stay in bed.Resting more than 1 or 2 days can delay your recovery.  Do not avoid exercise or work.Your body is made to move.It is not dangerous to be active, even though your back may hurt.Your back will likely heal faster if you return to being active before your pain is gone.  Pay attention to your body when you bend and lift. Many people have less discomfortwhen lifting if they bend their knees, keep the load close to their bodies,and  avoid twisting. Often, the most comfortable positions are those that put less stress on your recovering back.  Find a comfortable position to sleep. Use a firm mattress and lie on your side with your knees slightly bent. If you lie on your back, put a pillow under your knees.  Only take over-the-counter or prescription medicines as directed by your caregiver. Over-the-counter medicines to reduce pain and inflammation are often the most helpful.Your caregiver may prescribe muscle relaxant drugs.These medicines help dull your pain so you can more quickly return to your normal activities and healthy exercise.  Put ice on the injured area.  Put ice in a plastic bag.  Place a towel between your skin and the bag.  Leave the ice on for 15-20 minutes, 03-04 times a day for the first 2 to 3 days. After that, ice and heat may be alternated to reduce pain and spasms.  Ask your caregiver about trying back exercises and gentle massage. This may be of some benefit.  Avoid feeling anxious or stressed.Stress increases muscle tension and can worsen back pain.It is important to recognize when you are anxious or stressed and learn ways to manage it.Exercise is a great option. SEEK MEDICAL CARE IF:  You have pain that is not relieved with rest or medicine.  You have pain that does not improve in 1 week.  You have new symptoms.  You are generally not feeling well. SEEK   IMMEDIATE MEDICAL CARE IF:   You have pain that radiates from your back into your legs.  You develop new bowel or bladder control problems.  You have unusual weakness or numbness in your arms or legs.  You develop nausea or vomiting.  You develop abdominal pain.  You feel faint. Document Released: 03/24/2005 Document Revised: 09/23/2011 Document Reviewed: 08/12/2010 [employer] Patient Information 2014 [employer].  

## 2014-06-04 ENCOUNTER — Ambulatory Visit (INDEPENDENT_AMBULATORY_CARE_PROVIDER_SITE_OTHER): Payer: Managed Care, Other (non HMO) | Admitting: Family Medicine

## 2014-06-04 VITALS — BP 112/80 | HR 103 | Temp 99.9°F | Resp 16 | Ht 64.5 in | Wt 203.4 lb

## 2014-06-04 DIAGNOSIS — J029 Acute pharyngitis, unspecified: Secondary | ICD-10-CM

## 2014-06-04 DIAGNOSIS — R05 Cough: Secondary | ICD-10-CM

## 2014-06-04 DIAGNOSIS — G43001 Migraine without aura, not intractable, with status migrainosus: Secondary | ICD-10-CM

## 2014-06-04 DIAGNOSIS — J321 Chronic frontal sinusitis: Secondary | ICD-10-CM

## 2014-06-04 DIAGNOSIS — R059 Cough, unspecified: Secondary | ICD-10-CM

## 2014-06-04 MED ORDER — BENZONATATE 200 MG PO CAPS: 200.0000 mg | ORAL_CAPSULE | Freq: Two times a day (BID) | ORAL | Status: DC | PRN

## 2014-06-04 MED ORDER — FLUTICASONE PROPIONATE 50 MCG/ACT NA SUSP: 2.0000 | Freq: Every day | NASAL | Status: DC

## 2014-06-04 MED ORDER — CHLORHEXIDINE GLUCONATE 0.12 % MT SOLN: 15.0000 mL | Freq: Two times a day (BID) | OROMUCOSAL | Status: DC

## 2014-06-04 MED ORDER — AZITHROMYCIN 250 MG PO TABS: ORAL_TABLET | ORAL | Status: DC

## 2014-06-04 NOTE — Progress Notes (Signed)
° °  Subjective:    Patient ID: Breanna Valenzuela Asbridge, female    DOB: [birth year], 34 y.o.   MRN: [id number]  HPI Chief Complaint  Patient presents with   Generalized Body Aches   Chills   Dry Throat   Cough    Dry   Fever   This chart was scribed for Elvina SidleKurt Lauenstein, MD by Andrew Auaven Small, ED Scribe. This patient was seen in room 1 and the patient's care was started at 3:06 PM.  HPI Comments: Breanna Valenzuela Railey is a 34 y.o. female who presents to the [facility] complaining of cough, fever and chills that began 1 day ago. Pt states she had similar symptoms  about 1 week ago, got better, but symptoms have returned. She reports associated sore throat, HA, and nausea. Pt has chronic sinus problems in which she take Claritin and albuterol.  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   Past Medical History  Diagnosis Date   Pap smear abnormality of cervix    Allergy    History reviewed. No pertinent past surgical history. Prior to Admission medications   Medication Sig Start Date End Date Taking? Authorizing Provider  Dextromethorphan-Guaifenesin 5-100 MG/5ML LIQD Take by mouth.   Yes Historical Provider, MD  Ibuprofen 200 MG CAPS Take 600 mg by mouth as needed.   Yes Historical Provider, MD    Review of Systems  Constitutional: Positive for fever and chills.  HENT: Positive for sore throat.   Respiratory: Positive for cough.   Gastrointestinal: Positive for nausea.  Musculoskeletal: Positive for myalgias.  Neurological: Positive for headaches.   Objective:   Physical Exam  Constitutional: She is oriented to person, place, and time. She appears well-developed and well-nourished. No distress.  HENT:  Head: Normocephalic and atraumatic.  Right Ear: Tympanic membrane is erythematous.  Left Ear: Tympanic membrane is erythematous.  Eyes: Conjunctivae and EOM are normal.  Neck: Neck supple.  Cardiovascular: Normal rate.   Pulmonary/Chest: Effort normal.    Musculoskeletal: Normal range of motion.  Neurological: She is alert and oriented to person, place, and time.  Skin: Skin is warm and dry.  Psychiatric: She has a normal mood and affect. Her behavior is normal.  Nursing note and vitals reviewed.  Filed Vitals:   06/04/14 1458  BP: 112/80  Pulse: 103  Temp: 99.9 F (37.7 C)  Resp: 16    Assessment & Plan:   1. Chronic frontal sinusitis   2. Cough   3. Sore throat   4. Migraine without aura and with status migrainosus, not intractable    Meds ordered this encounter  Medications   Ibuprofen 200 MG CAPS    Sig: Take 600 mg by mouth as needed.   Dextromethorphan-Guaifenesin 5-100 MG/5ML LIQD    Sig: Take by mouth.   This chart was scribed in my presence and reviewed by me personally.    ICD-9-CM ICD-10-CM   1. Chronic frontal sinusitis 473.1 J32.1 fluticasone (FLONASE) 50 MCG/ACT nasal spray  2. Cough 786.2 R05 benzonatate (TESSALON) 200 MG capsule     azithromycin (ZITHROMAX Z-PAK) 250 MG tablet  3. Sore throat 462 J02.9 azithromycin (ZITHROMAX Z-PAK) 250 MG tablet     chlorhexidine (PERIDEX) 0.12 % solution  4. Migraine without aura and with status migrainosus, not intractable 346.12 G43.001      Signed, Elvina SidleKurt Lauenstein, MD

## 2015-07-10 ENCOUNTER — Emergency Department (HOSPITAL_COMMUNITY)
Admission: EM | Admit: 2015-07-10 | Discharge: 2015-07-10 | Disposition: A | Discharge status: Home / Self Care | Payer: Self-pay | Attending: Emergency Medicine | Admitting: Emergency Medicine

## 2015-07-10 ENCOUNTER — Emergency Department (HOSPITAL_COMMUNITY): Payer: No Typology Code available for payment source

## 2015-07-10 ENCOUNTER — Encounter (HOSPITAL_COMMUNITY): Payer: Self-pay | Admitting: Emergency Medicine

## 2015-07-10 DIAGNOSIS — Y9389 Activity, other specified: Secondary | ICD-10-CM | POA: Insufficient documentation

## 2015-07-10 DIAGNOSIS — Z87891 Personal history of nicotine dependence: Secondary | ICD-10-CM | POA: Insufficient documentation

## 2015-07-10 DIAGNOSIS — Y998 Other external cause status: Secondary | ICD-10-CM | POA: Insufficient documentation

## 2015-07-10 DIAGNOSIS — S199XXA Unspecified injury of neck, initial encounter: Secondary | ICD-10-CM | POA: Insufficient documentation

## 2015-07-10 DIAGNOSIS — Z79899 Other long term (current) drug therapy: Secondary | ICD-10-CM | POA: Insufficient documentation

## 2015-07-10 DIAGNOSIS — Y92410 Unspecified street and highway as the place of occurrence of the external cause: Secondary | ICD-10-CM | POA: Insufficient documentation

## 2015-07-10 DIAGNOSIS — Z7951 Long term (current) use of inhaled steroids: Secondary | ICD-10-CM | POA: Insufficient documentation

## 2015-07-10 DIAGNOSIS — S3992XA Unspecified injury of lower back, initial encounter: Secondary | ICD-10-CM | POA: Insufficient documentation

## 2015-07-10 MED ORDER — KETOROLAC TROMETHAMINE 30 MG/ML IJ SOLN
30.0000 mg | Freq: Once | INTRAMUSCULAR | Status: AC
  Administered 2015-07-10: 30 mg via INTRAMUSCULAR
  Filled 2015-07-10: qty 1

## 2015-07-10 MED ORDER — DIAZEPAM 2 MG PO TABS: 2.0000 mg | ORAL_TABLET | Freq: Three times a day (TID) | ORAL | Status: AC

## 2015-07-10 MED ORDER — IBUPROFEN 800 MG PO TABS: 800.0000 mg | ORAL_TABLET | Freq: Three times a day (TID) | ORAL | Status: DC

## 2015-07-10 NOTE — ED Notes (Signed)
Patient transported to X-ray 
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
## 2015-07-10 NOTE — ED Notes (Signed)
Discharge instructions, follow up care, and rx x2 reviewed with patient. Patient verbalized understanding. 
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
## 2015-07-10 NOTE — ED Notes (Signed)
MD at bedside. 

## 2018-05-24 ENCOUNTER — Ambulatory Visit (HOSPITAL_COMMUNITY)
Admission: EM | Admit: 2018-05-24 | Discharge: 2018-05-24 | Disposition: A | Discharge status: Home / Self Care | Payer: BLUE CROSS/BLUE SHIELD | Attending: Family Medicine | Admitting: Family Medicine

## 2018-05-24 ENCOUNTER — Ambulatory Visit (INDEPENDENT_AMBULATORY_CARE_PROVIDER_SITE_OTHER): Payer: BLUE CROSS/BLUE SHIELD

## 2018-05-24 ENCOUNTER — Encounter (HOSPITAL_COMMUNITY): Payer: Self-pay | Admitting: Emergency Medicine

## 2018-05-24 DIAGNOSIS — W2209XA Striking against other stationary object, initial encounter: Secondary | ICD-10-CM | POA: Diagnosis not present

## 2018-05-24 DIAGNOSIS — M79672 Pain in left foot: Secondary | ICD-10-CM

## 2018-05-24 DIAGNOSIS — M79675 Pain in left toe(s): Secondary | ICD-10-CM

## 2018-05-24 DIAGNOSIS — S9032XA Contusion of left foot, initial encounter: Secondary | ICD-10-CM

## 2018-05-24 NOTE — ED Triage Notes (Signed)
Pt sts left great toe and foot pain after hitting toe 3 days ago

## 2018-10-05 NOTE — ED Provider Notes (Signed)
[facility]   [id number] 05/24/18 Arrival Time: 1047  ASSESSMENT & PLAN:  1. Contusion of left foot, initial encounter     I have personally viewed the imaging studies ordered this visit. No fractures seen.  OTC analgesics.  Orders Placed This Encounter  Procedures  . DG Foot Complete Left  . Post op boot   WBAT.  May f/u here if not improving over the next 1-2 weeks.  Reviewed expectations re: course of current medical issues. Questions answered. Outlined signs and symptoms indicating need for more acute intervention. Patient verbalized understanding. After Visit Summary given.  SUBJECTIVE: History from: patient. Lonna CobbSheree Cardamone is a 38 y.o. female who reports persistent mild to moderate pain of her left great toe/foot; described as aching without radiation. Onset: abrupt, 3 d ago. Injury/trama: yes, reports accidentally kicking hard object; immediate pain. Symptoms have progressed to a point and plateaued since beginning. Aggravating factors: movement. Alleviating factors: rest. Associated symptoms: none reported. Extremity sensation changes or weakness: none. Self treatment: tried OTCs with relief of pain. History of similar: no.  History reviewed. No pertinent surgical history.   ROS: As per HPI.   OBJECTIVE:  Vitals:   05/24/18 1210  BP: 119/76  Pulse: 72  Resp: 18  Temp: 98 F (36.7 C)  TempSrc: Temporal  SpO2: 100%    General appearance: alert; no distress HEENT: [place]; AT Neck: supple with FROM Resp: unlabored respirations Extremities: . LLE: warm and well perfused; poorly localized moderate tenderness over left great toe extending toward distal foot; without gross deformities; with mild swelling; with no bruising; ROM: normal with reported discomfort CV: brisk extremity capillary refill of LLE; 2+ DP and PT pulse of LLE. Skin: warm and dry; no visible rashes Neurologic: gait normal but favors LLE; normal reflexes of RLE and LLE; normal  sensation of RLE and LLE; normal strength of RLE and LLE Psychological: alert and cooperative; normal mood and affect  Imaging: Dg Foot Complete Left  Result Date: 05/24/2018 CLINICAL DATA:  Patient states that she injured her left foot x 3 days ago, most pain in the left great toe and 1st-2nd metacarpals. No Hx EXAM: LEFT FOOT - COMPLETE 3+ VIEW COMPARISON:  None. FINDINGS: No fracture or bone lesion. Joints are normally spaced and aligned.  No arthropathic changes. There is forefoot soft tissue swelling most evident dorsally. IMPRESSION: No fracture or dislocation Electronically Signed   By: Amie Portlandavid  Ormond M.D.   On: 05/24/2018 12:42    Allergies  Allergen Reactions  . Oxycodone Nausea And Vomiting and Other (See Comments)    Patient gets delirious, says its too strong   . Tomato Itching, Swelling and Rash  . Pineapple Itching, Swelling and Rash    Past Medical History:  Diagnosis Date  . Allergy   . Pap smear abnormality of cervix    Social History   Socioeconomic History  . Marital status: Single    Spouse name: Not on file  . Number of children: Not on file  . Years of education: Not on file  . Highest education level: Not on file  Occupational History  . Not on file  Social Needs  . Financial resource strain: Not on file  . Food insecurity    Worry: Not on file    Inability: Not on file  . Transportation needs    Medical: Not on file    Non-medical: Not on file  Tobacco Use  . Smoking status: Former Games developermoker  . Smokeless tobacco: Never  Used  Substance and Sexual Activity  . Alcohol use: Yes    Alcohol/week: 0.0 standard drinks    Comment: occ  . Drug use: No  . Sexual activity: Not on file  Lifestyle  . Physical activity    Days per week: Not on file    Minutes per session: Not on file  . Stress: Not on file  Relationships  . Social Herbalist on phone: Not on file    Gets together: Not on file    Attends religious service: Not on file    Active  member of club or organization: Not on file    Attends meetings of clubs or organizations: Not on file    Relationship status: Not on file  Other Topics Concern  . Not on file  Social History Narrative  . Not on file   Family History  Problem Relation Age of Onset  . Diabetes Mother   . Hyperlipidemia Mother   . Mental illness Brother   . Hyperlipidemia Brother   . Mental illness Brother    History reviewed. No pertinent surgical history.    Vanessa Kick, MD 10/05/18 1059

## 2019-05-11 ENCOUNTER — Telehealth (INDEPENDENT_AMBULATORY_CARE_PROVIDER_SITE_OTHER): Payer: 59 | Admitting: Registered Nurse

## 2019-05-11 ENCOUNTER — Other Ambulatory Visit: Payer: Self-pay

## 2019-05-11 DIAGNOSIS — Z20822 Contact with and (suspected) exposure to covid-19: Secondary | ICD-10-CM | POA: Diagnosis not present

## 2019-05-11 DIAGNOSIS — R059 Cough, unspecified: Secondary | ICD-10-CM

## 2019-05-11 DIAGNOSIS — R05 Cough: Secondary | ICD-10-CM

## 2019-05-11 MED ORDER — GUAIFENESIN-DM 100-10 MG/5ML PO SYRP: 5.0000 mL | ORAL_SOLUTION | ORAL | 0 refills | Status: DC | PRN

## 2019-05-11 NOTE — Progress Notes (Signed)
Patient states she is having flu like symptoms. Chills , dry cough , body aches for the past three days. Patient has been taking Tylenol & Vitamin D. She stated that her job sent her home last night and also tested yesterday with a rapid test and it was negative. But her mom just tested positive this week , and they have been in contact with each other.  PHQ9 =9

## 2019-05-11 NOTE — Progress Notes (Signed)
Telemedicine Encounter- SOAP NOTE Established Patient  This telephone encounter was conducted with the patient's (or proxy's) verbal consent via audio telecommunications: yes  Patient was instructed to have this encounter in a suitably private space; and to only have persons present to whom they give permission to participate. In addition, patient identity was confirmed by use of name plus two identifiers (DOB and address).  I discussed the limitations, risks, security and privacy concerns of performing an evaluation and management service by telephone and the availability of in person appointments. I also discussed with the patient that there may be a patient responsible charge related to this service. The patient expressed understanding and agreed to proceed.  I spent a total of 18 minutes talking with the patient or their proxy.  No chief complaint on file.   Subjective   Breanna Valenzuela is a 39 y.o. established patient. Telephone visit today for flu like symptoms  HPI Onset three days ago. Tested for COVID on Monday - negative. 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 Her mother has similar symptoms and tested positive. Notes that she's had cough, shob, myalgias, and chills for the duration. 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 Shob improving regardless.  Has been taking tylenol and Vitamin D for relief - some effect.  Wants to know next steps.  Denies sensory changes, headache, vomiting, nasal congestion, ear pain, high fevers, loc, cyanosis.   There are no problems to display for this patient.   Past Medical History:  Diagnosis Date  . Allergy   . Pap smear abnormality of cervix     Current Outpatient Medications  Medication Sig Dispense Refill  . diphenhydrAMINE (BENADRYL) 25 mg capsule Take 25 mg by mouth every 6 (six) hours as needed for allergies.    Marland Kitchen ibuprofen (ADVIL,MOTRIN) 800 MG tablet  Take 1 tablet (800 mg total) by mouth 3 (three) times daily. (Patient not taking: Reported on 05/11/2019) 15 tablet 0   No current facility-administered medications for this visit.    Allergies  Allergen Reactions  . Oxycodone Nausea And Vomiting and Other (See Comments)    Patient gets delirious, says its too strong   . Tomato Itching, Swelling and Rash  . Pineapple Itching, Swelling and Rash    Social History   Socioeconomic History  . Marital status: Single    Spouse name: Not on file  . Number of children: Not on file  . Years of education: Not on file  . Highest education level: Not on file  Occupational History  . Not on file  Tobacco Use  . Smoking status: Former Games developer  . Smokeless tobacco: Never Used  Substance and Sexual Activity  . Alcohol use: Yes    Alcohol/week: 0.0 standard drinks    Comment: occ  . Drug use: No  . Sexual activity: Not on file  Other Topics Concern  . Not on file  Social History Narrative  . Not on file   Social Determinants of Health   Financial Resource Strain:   . Difficulty of Paying Living Expenses: Not on file  Food Insecurity:   . Worried About Programme researcher, broadcasting/film/video in the Last Year: Not on file  . Ran Out of Food in the Last Year: Not on file  Transportation Needs:   . Lack of Transportation (Medical): Not on file  . Lack of Transportation (Non-Medical): Not on file  Physical Activity:   .  Days of Exercise per Week: Not on file  . Minutes of Exercise per Session: Not on file  Stress:   . Feeling of Stress : Not on file  Social Connections:   . Frequency of Communication with Friends and Family: Not on file  . Frequency of Social Gatherings with Friends and Family: Not on file  . Attends Religious Services: Not on file  . Active Member of Clubs or Organizations: Not on file  . Attends Archivist Meetings: Not on file  . Marital Status: Not on file  Intimate Partner Violence:   . Fear of Current or Ex-Partner: Not  on file  . Emotionally Abused: Not on file  . Physically Abused: Not on file  . Sexually Abused: Not on file    Review of Systems  Constitutional: Positive for chills and malaise/fatigue. Negative for diaphoresis, fever and weight loss.  HENT: Negative.   Eyes: Negative.   Respiratory: Positive for cough and shortness of breath. Negative for hemoptysis, sputum production and wheezing.   Cardiovascular: Negative.   Gastrointestinal: Positive for diarrhea (one instance, resolved). Negative for abdominal pain, blood in stool, constipation, heartburn, melena, nausea and vomiting.  Genitourinary: Negative.   Musculoskeletal: Positive for myalgias. Negative for back pain, falls, joint pain and neck pain.  Skin: Negative.   Neurological: Negative.  Negative for dizziness, sensory change, loss of consciousness, weakness and headaches.  Endo/Heme/Allergies: Negative.   Psychiatric/Behavioral: Negative.  Negative for depression, hallucinations, memory loss, substance abuse and suicidal ideas. The patient is not nervous/anxious and does not have insomnia.   All other systems reviewed and are negative.   Objective   Vitals as reported by the patient: There were no vitals filed for this visit.  Diagnoses and all orders for this visit:  Suspected COVID-19 virus infection  Cough -     guaiFENesin-dextromethorphan (ROBITUSSIN DM) 100-10 MG/5ML syrup; Take 5 mLs by mouth every 4 (four) hours as needed for cough.   PLAN  Will send robitussin DM for cough relief  Discussed supportive care  Discussed red flag symptoms  Discussed return to work protocols  Patient demonstrates understanding of all of the above  Work note will be provided  Patient encouraged to call clinic with any questions, comments, or concerns.   I discussed the assessment and treatment plan with the patient. The patient was provided an opportunity to ask questions and all were answered. The patient agreed with the  plan and demonstrated an understanding of the instructions.   The patient was advised to call back or seek an in-person evaluation if the symptoms worsen or if the condition fails to improve as anticipated.  I provided 16 minutes of non-face-to-face time during this encounter.  Maximiano Coss, NP  Primary Care at [facility]

## 2019-05-12 ENCOUNTER — Telehealth: Payer: Self-pay | Admitting: Registered Nurse

## 2019-05-12 ENCOUNTER — Ambulatory Visit: Payer: Self-pay

## 2019-05-12 ENCOUNTER — Encounter: Payer: Self-pay | Admitting: General Practice

## 2019-05-12 NOTE — Telephone Encounter (Signed)
Patient is calling to give update on her symptoms to  let Luan Pulling know that she is having shortness of breath respiratory  Distress but  getting harder   to clear congestion  diarreaa headache tested negative in past 14 days  symptoms no worse but no better .but having new symptoms set pt appt for  [facility] respiratory clinic for today at 6:30

## 2019-05-12 NOTE — Telephone Encounter (Signed)
Patient is calling to give update on her symptoms to  let Luan Pulling know that she is having shortness of breath respiratory  Distress but  getting harder   to clear congestion  diarreaa headache tested negative in past 14 days  symptoms no worse but no better .but having new symptoms set pt appt for  [facility] respiratory clinic for today at 6:30   Please Advise

## 2019-05-16 ENCOUNTER — Encounter: Payer: Self-pay | Admitting: Registered Nurse

## 2019-05-31 ENCOUNTER — Telehealth (INDEPENDENT_AMBULATORY_CARE_PROVIDER_SITE_OTHER): Payer: 59 | Admitting: Registered Nurse

## 2019-05-31 ENCOUNTER — Other Ambulatory Visit: Payer: Self-pay

## 2019-05-31 DIAGNOSIS — U071 COVID-19: Secondary | ICD-10-CM | POA: Diagnosis not present

## 2019-05-31 DIAGNOSIS — J1282 Pneumonia due to coronavirus disease 2019: Secondary | ICD-10-CM | POA: Diagnosis not present

## 2019-05-31 MED ORDER — AZITHROMYCIN 250 MG PO TABS: ORAL_TABLET | ORAL | 0 refills | Status: DC

## 2019-05-31 NOTE — Progress Notes (Signed)
Telemedicine Encounter- SOAP NOTE Established Patient  This telephone encounter was conducted with the patient's (or proxy's) verbal consent via audio telecommunications: yes  Patient was instructed to have this encounter in a suitably private space; and to only have persons present to whom they give permission to participate. In addition, patient identity was confirmed by use of name plus two identifiers (DOB and address).  I discussed the limitations, risks, security and privacy concerns of performing an evaluation and management service by telephone and the availability of in person appointments. I also discussed with the patient that there may be a patient responsible charge related to this service. The patient expressed understanding and agreed to proceed.  I spent a total of 15 minutes talking with the patient or their proxy.  No chief complaint on file.   Subjective   Scherrie Seneca is a 39 y.o. established patient. Telephone visit today for ongoing COVID symptoms  HPI Pt first assessed for COVID symptoms on 05/11/19 by myself via telemed She then tested positive on 05/16/19.  Her symptoms now include sinus pain and pressure, productive cough, diarrhea, and muscle soreness.  Otherwise, feeling somewhat better, breathing has overall improved. No further symptoms.  Notes that she is due for CPE and pap - she will schedule this for 6-8 weeks out.  There are no problems to display for this patient.   Past Medical History:  Diagnosis Date  . Allergy   . Pap smear abnormality of cervix     Current Outpatient Medications  Medication Sig Dispense Refill  . azithromycin (ZITHROMAX) 250 MG tablet Take 2 tablets on first day, then take 1 tablet daily. Finish entire supply. 6 tablet 0  . diphenhydrAMINE (BENADRYL) 25 mg capsule Take 25 mg by mouth every 6 (six) hours as needed for allergies.    Marland Kitchen guaiFENesin-dextromethorphan (ROBITUSSIN DM) 100-10 MG/5ML syrup Take 5 mLs by mouth every  4 (four) hours as needed for cough. (Patient not taking: Reported on 05/31/2019) 118 mL 0  . ibuprofen (ADVIL,MOTRIN) 800 MG tablet Take 1 tablet (800 mg total) by mouth 3 (three) times daily. (Patient not taking: Reported on 05/11/2019) 15 tablet 0   No current facility-administered medications for this visit.    Allergies  Allergen Reactions  . Oxycodone Nausea And Vomiting and Other (See Comments)    Patient gets delirious, says its too strong   . Tomato Itching, Swelling and Rash  . Pineapple Itching, Swelling and Rash    Social History   Socioeconomic History  . Marital status: Single    Spouse name: Not on file  . Number of children: Not on file  . Years of education: Not on file  . Highest education level: Not on file  Occupational History  . Not on file  Tobacco Use  . Smoking status: Former Research scientist (life sciences)  . Smokeless tobacco: Never Used  Substance and Sexual Activity  . Alcohol use: Yes    Alcohol/week: 0.0 standard drinks    Comment: occ  . Drug use: No  . Sexual activity: Not on file  Other Topics Concern  . Not on file  Social History Narrative  . Not on file   Social Determinants of Health   Financial Resource Strain:   . Difficulty of Paying Living Expenses: Not on file  Food Insecurity:   . Worried About Charity fundraiser in the Last Year: Not on file  . Ran Out of Food in the Last Year: Not on file  Transportation  Needs:   . Lack of Transportation (Medical): Not on file  . Lack of Transportation (Non-Medical): Not on file  Physical Activity:   . Days of Exercise per Week: Not on file  . Minutes of Exercise per Session: Not on file  Stress:   . Feeling of Stress : Not on file  Social Connections:   . Frequency of Communication with Friends and Family: Not on file  . Frequency of Social Gatherings with Friends and Family: Not on file  . Attends Religious Services: Not on file  . Active Member of Clubs or Organizations: Not on file  . Attends Tax inspector Meetings: Not on file  . Marital Status: Not on file  Intimate Partner Violence:   . Fear of Current or Ex-Partner: Not on file  . Emotionally Abused: Not on file  . Physically Abused: Not on file  . Sexually Abused: Not on file    Review of Systems  Constitutional: Positive for malaise/fatigue. Negative for chills, diaphoresis, fever and weight loss.  HENT: Positive for congestion and sinus pain. Negative for ear discharge, ear pain, hearing loss, nosebleeds, sore throat and tinnitus.   Eyes: Negative.   Respiratory: Positive for cough and sputum production. Negative for hemoptysis, shortness of breath, wheezing and stridor.   Cardiovascular: Negative.   Gastrointestinal: Negative.   Genitourinary: Negative.   Musculoskeletal: Positive for myalgias. Negative for back pain, falls, joint pain and neck pain.  Skin: Negative.   Neurological: Negative.   Endo/Heme/Allergies: Negative.   Psychiatric/Behavioral: Negative.   All other systems reviewed and are negative.   Objective   Vitals as reported by the patient: There were no vitals filed for this visit.  Diagnoses and all orders for this visit:  Pneumonia due to COVID-19 virus -     azithromycin (ZITHROMAX) 250 MG tablet; Take 2 tablets on first day, then take 1 tablet daily. Finish entire supply.   PLAN  Suspicious for PNA d/t COVID. Will treat with z pack  Work note until 06/06/19 written and sent via MyChart  Return to clinic precautions given  Discussed general course of covid and lingering symptoms  Patient encouraged to call clinic with any questions, comments, or concerns.   I discussed the assessment and treatment plan with the patient. The patient was provided an opportunity to ask questions and all were answered. The patient agreed with the plan and demonstrated an understanding of the instructions.   The patient was advised to call back or seek an in-person evaluation if the symptoms worsen or  if the condition fails to improve as anticipated.  I provided 15 minutes of non-face-to-face time during this encounter.  Janeece Agee, NP  Primary Care at [facility]

## 2019-05-31 NOTE — Progress Notes (Signed)
Patient states she tested positive on February 8th she is breathing better now ,but having some sinus issues. Also have some other symptoms like coughing , diarrhea , and still  Very sore. Per patient states she been taking robitussin Dm OTC to get some kind of relief.  PHQ9=13

## 2019-06-20 ENCOUNTER — Encounter: Payer: Self-pay | Admitting: Registered Nurse

## 2019-06-20 NOTE — Telephone Encounter (Signed)
Please Advise

## 2019-07-25 ENCOUNTER — Ambulatory Visit (INDEPENDENT_AMBULATORY_CARE_PROVIDER_SITE_OTHER): Payer: 59 | Admitting: Registered Nurse

## 2019-07-25 ENCOUNTER — Other Ambulatory Visit (HOSPITAL_COMMUNITY)
Admission: RE | Admit: 2019-07-25 | Discharge: 2019-07-25 | Disposition: A | Discharge status: Home / Self Care | Payer: 59 | Source: Ambulatory Visit | Attending: Registered Nurse | Admitting: Registered Nurse

## 2019-07-25 ENCOUNTER — Encounter: Payer: Self-pay | Admitting: Registered Nurse

## 2019-07-25 ENCOUNTER — Other Ambulatory Visit: Payer: Self-pay

## 2019-07-25 VITALS — BP 118/78 | HR 75 | Temp 97.3°F | Ht 64.0 in | Wt 220.0 lb

## 2019-07-25 DIAGNOSIS — Z0001 Encounter for general adult medical examination with abnormal findings: Secondary | ICD-10-CM | POA: Diagnosis not present

## 2019-07-25 DIAGNOSIS — Z130 Encounter for screening for diseases of the blood and blood-forming organs and certain disorders involving the immune mechanism: Secondary | ICD-10-CM

## 2019-07-25 DIAGNOSIS — Z124 Encounter for screening for malignant neoplasm of cervix: Secondary | ICD-10-CM | POA: Diagnosis present

## 2019-07-25 DIAGNOSIS — Z1329 Encounter for screening for other suspected endocrine disorder: Secondary | ICD-10-CM

## 2019-07-25 DIAGNOSIS — R109 Unspecified abdominal pain: Secondary | ICD-10-CM | POA: Diagnosis present

## 2019-07-25 DIAGNOSIS — Z113 Encounter for screening for infections with a predominantly sexual mode of transmission: Secondary | ICD-10-CM

## 2019-07-25 DIAGNOSIS — Z13220 Encounter for screening for lipoid disorders: Secondary | ICD-10-CM | POA: Diagnosis not present

## 2019-07-25 DIAGNOSIS — Z23 Encounter for immunization: Secondary | ICD-10-CM | POA: Diagnosis not present

## 2019-07-25 DIAGNOSIS — Z13228 Encounter for screening for other metabolic disorders: Secondary | ICD-10-CM

## 2019-07-25 LAB — POCT URINALYSIS DIP (MANUAL ENTRY)
Bilirubin, UA: NEGATIVE
Glucose, UA: NEGATIVE mg/dL
Ketones, POC UA: NEGATIVE mg/dL
Leukocytes, UA: NEGATIVE
Nitrite, UA: NEGATIVE
Protein Ur, POC: NEGATIVE mg/dL
Spec Grav, UA: >=1.03 — AB (ref 1.010–1.025)
Urobilinogen, UA: 0.2 E.U./dL
pH, UA: 5 (ref 5.0–8.0)

## 2019-07-25 NOTE — Progress Notes (Signed)
Established Patient Office Visit  Subjective:  Patient ID: Breanna Valenzuela, female    DOB: Oct 20, 1980  Age: 39 y.o. MRN: [id number]  CC:  Chief Complaint  Patient presents with  . Annual Exam    physcial and would like to discuss pcos    HPI Breanna Valenzuela presents for CPE and labs Due for pap  Concerned for PCOS - irregular menses. Usually more freq than once monthly. Last 5 days, moderate intensity. Notes increased body hair. Some pelvic cramping, midline. No hx of this. Hoping to have a child, wants to make sure she is healthy before this occurs. Not currently sexually active.   Additionally, notes some mild to moderate thoracic and cervical spine pain which she believes is related to heavy breasts. While we discussed options for addressing this, we agreed to perform a work up for PCOS and monitor this closely. She will exercise as tolerated and stretch frequently. No peripheral symptoms, loss of bowel or bladder control, headaches, or acute injury noted.   Otherwise, feeling well.    Past Medical History:  Diagnosis Date  . Allergy   . Pap smear abnormality of cervix     No past surgical history on file.  Family History  Problem Relation Age of Onset  . Diabetes Mother   . Hyperlipidemia Mother   . Mental illness Brother   . Hyperlipidemia Brother   . Mental illness Brother     Social History   Socioeconomic History  . Marital status: Single    Spouse name: Not on file  . Number of children: Not on file  . Years of education: Not on file  . Highest education level: Not on file  Occupational History  . Not on file  Tobacco Use  . Smoking status: Former Games developer  . Smokeless tobacco: Never Used  Substance and Sexual Activity  . Alcohol use: Yes    Alcohol/week: 0.0 standard drinks    Comment: occ  . Drug use: No  . Sexual activity: Not on file  Other Topics Concern  . Not on file  Social History Narrative  . Not on file   Social Determinants of Health    Financial Resource Strain:   . Difficulty of Paying Living Expenses:   Food Insecurity:   . Worried About Programme researcher, broadcasting/film/video in the Last Year:   . Barista in the Last Year:   Transportation Needs:   . Freight forwarder (Medical):   Marland Kitchen Lack of Transportation (Non-Medical):   Physical Activity:   . Days of Exercise per Week:   . Minutes of Exercise per Session:   Stress:   . Feeling of Stress :   Social Connections:   . Frequency of Communication with Friends and Family:   . Frequency of Social Gatherings with Friends and Family:   . Attends Religious Services:   . Active Member of Clubs or Organizations:   . Attends Banker Meetings:   Marland Kitchen Marital Status:   Intimate Partner Violence:   . Fear of Current or Ex-Partner:   . Emotionally Abused:   Marland Kitchen Physically Abused:   . Sexually Abused:     Outpatient Medications Prior to Visit  Medication Sig Dispense Refill  . diphenhydrAMINE (BENADRYL) 25 mg capsule Take 25 mg by mouth every 6 (six) hours as needed for allergies.    Marland Kitchen azithromycin (ZITHROMAX) 250 MG tablet Take 2 tablets on first day, then take 1 tablet daily. Finish entire supply. 6  tablet 0  . guaiFENesin-dextromethorphan (ROBITUSSIN DM) 100-10 MG/5ML syrup Take 5 mLs by mouth every 4 (four) hours as needed for cough. (Patient not taking: Reported on 05/31/2019) 118 mL 0  . ibuprofen (ADVIL,MOTRIN) 800 MG tablet Take 1 tablet (800 mg total) by mouth 3 (three) times daily. (Patient not taking: Reported on 05/11/2019) 15 tablet 0   No facility-administered medications prior to visit.    Allergies  Allergen Reactions  . Oxycodone Nausea And Vomiting and Other (See Comments)    Patient gets delirious, says its too strong   . Tomato Itching, Swelling and Rash  . Pineapple Itching, Swelling and Rash    ROS Review of Systems  Constitutional: Negative.   HENT: Negative.   Eyes: Negative.   Respiratory: Negative.   Cardiovascular: Negative.    Gastrointestinal: Negative.   Endocrine: Negative.   Genitourinary: Negative.   Musculoskeletal: Positive for back pain and neck pain. Negative for arthralgias, gait problem, joint swelling, myalgias and neck stiffness.  Skin: Negative.   Allergic/Immunologic: Negative.   Neurological: Negative.   Hematological: Negative.   Psychiatric/Behavioral: Negative.   All other systems reviewed and are negative.     Objective:    Physical Exam  Constitutional: She is oriented to person, place, and time. She appears well-developed and well-nourished. No distress.  HENT:  Head: Normocephalic and atraumatic.  Right Ear: External ear normal.  Left Ear: External ear normal.  Nose: Nose normal.  Mouth/Throat: Oropharynx is clear and moist. No oropharyngeal exudate.  Eyes: Pupils are equal, round, and reactive to light. Conjunctivae and EOM are normal. Right eye exhibits no discharge. Left eye exhibits no discharge. No scleral icterus.  Neck: No JVD present. No tracheal deviation present. No thyromegaly present.  Cardiovascular: Normal rate, regular rhythm, normal heart sounds and intact distal pulses. Exam reveals no gallop and no friction rub.  No murmur heard. Pulmonary/Chest: Effort normal and breath sounds normal. No respiratory distress. She has no wheezes. She has no rales. She exhibits no tenderness.  Abdominal: Soft. Bowel sounds are normal. She exhibits no distension and no mass. There is no abdominal tenderness. There is no rebound and no guarding.  Genitourinary:    Vagina and uterus normal.     Pelvic exam was performed with patient in the knee-chest position.  Uterus is not deviated, not enlarged and not tender. Cervix exhibits no motion tenderness, no discharge and no friability.    No vaginal discharge.   Musculoskeletal:        General: Tenderness (thoracic and cervical spine) present. No deformity or edema. Normal range of motion.     Cervical back: Normal range of motion and  neck supple.  Lymphadenopathy:    She has no cervical adenopathy.  Neurological: She is alert and oriented to person, place, and time. No cranial nerve deficit. She exhibits normal muscle tone. Coordination normal.  Skin: Skin is warm and dry. No rash noted. She is not diaphoretic. No erythema. No pallor.  Psychiatric: She has a normal mood and affect. Her behavior is normal. Judgment and thought content normal.  Nursing note and vitals reviewed.   BP 118/78   Pulse 75   Temp (!) 97.3 F (36.3 C) (Temporal)   Ht 5\' 4"  (1.626 m)   Wt 220 lb (99.8 kg)   SpO2 98%   BMI 37.76 kg/m  Wt Readings from Last 3 Encounters:  07/25/19 220 lb (99.8 kg)  07/10/15 210 lb (95.3 kg)  06/04/14 203 lb 6 oz (92.3  kg)     Health Maintenance Due  Topic Date Due  . HIV Screening  Never done  . COVID-19 Vaccine (1) Never done  . PAP SMEAR-Modifier  Never done    There are no preventive care reminders to display for this patient.  No results found for: TSH Lab Results  Component Value Date   WBC 5.3 05/16/2013   HGB 14.4 05/16/2013   HCT 42.6 05/16/2013   MCV 84.9 05/16/2013   PLT 249 05/16/2013   Lab Results  Component Value Date   NA 139 05/16/2013   K 4.1 05/16/2013   CO2 24 05/16/2013   GLUCOSE 93 05/16/2013   BUN 12 05/16/2013   CREATININE 0.74 05/16/2013   BILITOT <0.2 (L) 05/16/2013   ALKPHOS 70 05/16/2013   AST 21 05/16/2013   ALT 17 05/16/2013   PROT 8.1 05/16/2013   ALBUMIN 3.8 05/16/2013   CALCIUM 9.3 05/16/2013   No results found for: CHOL No results found for: HDL No results found for: LDLCALC No results found for: TRIG No results found for: CHOLHDL No results found for: QVZD6L    Assessment & Plan:   Problem List Items Addressed This Visit    None    Visit Diagnoses    Abdominal pain, unspecified abdominal location    -  Primary   Relevant Orders   POCT urinalysis dipstick (Completed)   Cytology - PAP([place])   Screening for endocrine, metabolic  and immunity disorder       Relevant Orders   TSH   CBC with Differential   Comprehensive metabolic panel   Hemoglobin A1c   Follicle Stimulating Hormone   Prolactin   Lipid screening       Relevant Orders   Lipid panel   Screen for STD (sexually transmitted disease)       Relevant Orders   HIV antibody (with reflex)   RPR   Papanicolaou smear       Relevant Orders   Cytology - PAP([place])   Need for diphtheria-tetanus-pertussis (Tdap) vaccine       Relevant Orders   Tdap vaccine greater than or equal to 7yo IM (Completed)      No orders of the defined types were placed in this encounter.   Follow-up: No follow-ups on file.   PLAN  Some spinal tenderness on exam as expected, otherwise unremarkable   No signs of fibroid or tenderness in uterus on bimanual exam, ? Of ovarian tenderness.   Labs collected, will follow up as warranted  If no clear cause for oligomenorrhea on labs, will suggest referral to gyn or endo  Patient encouraged to call clinic with any questions, comments, or concerns.  Janeece Agee, NP

## 2019-07-25 NOTE — Progress Notes (Signed)
.  bw

## 2019-07-25 NOTE — Patient Instructions (Signed)
° ° ° °  If you have lab work done today you will be contacted with your lab results within the next 2 weeks.  If you have not heard from us then please contact us. The fastest way to get your results is to register for My Chart. ° ° °IF you received an x-ray today, you will receive an invoice from [facility]. Please contact [facility] at [phone] with questions or concerns regarding your invoice.  ° °IF you received labwork today, you will receive an invoice from [facility]. Please contact [facility] at [phone] with questions or concerns regarding your invoice.  ° °Our billing staff will not be able to assist you with questions regarding bills from these companies. ° °You will be contacted with the lab results as soon as they are available. The fastest way to get your results is to activate your My Chart account. Instructions are located on the last page of this paperwork. If you have not heard from us regarding the results in 2 weeks, please contact this office. °  ° ° ° °

## 2019-07-26 ENCOUNTER — Encounter: Payer: Self-pay | Admitting: Registered Nurse

## 2019-07-26 DIAGNOSIS — Z319 Encounter for procreative management, unspecified: Secondary | ICD-10-CM

## 2019-07-26 DIAGNOSIS — N926 Irregular menstruation, unspecified: Secondary | ICD-10-CM

## 2019-07-26 LAB — COMPREHENSIVE METABOLIC PANEL
ALT: 18 IU/L (ref 0–32)
AST: 16 IU/L (ref 0–40)
Albumin/Globulin Ratio: 1.2 (ref 1.2–2.2)
Albumin: 4.3 g/dL (ref 3.8–4.8)
Alkaline Phosphatase: 76 IU/L (ref 39–117)
BUN/Creatinine Ratio: 14 (ref 9–23)
BUN: 10 mg/dL (ref 6–20)
Bilirubin Total: 0.3 mg/dL (ref 0.0–1.2)
CO2: 24 mmol/L (ref 20–29)
Calcium: 9.5 mg/dL (ref 8.7–10.2)
Chloride: 102 mmol/L (ref 96–106)
Creatinine, Ser: 0.73 mg/dL (ref 0.57–1.00)
GFR calc Af Amer: 121 mL/min/{1.73_m2} (ref >59)
GFR calc non Af Amer: 105 mL/min/{1.73_m2} (ref >59)
Globulin, Total: 3.5 g/dL (ref 1.5–4.5)
Glucose: 83 mg/dL (ref 65–99)
Potassium: 3.9 mmol/L (ref 3.5–5.2)
Sodium: 139 mmol/L (ref 134–144)
Total Protein: 7.8 g/dL (ref 6.0–8.5)

## 2019-07-26 LAB — CYTOLOGY - PAP
Adequacy: ABSENT
Chlamydia: NEGATIVE
Comment: NEGATIVE
Comment: NEGATIVE
Comment: NORMAL
Diagnosis: NEGATIVE
Neisseria Gonorrhea: NEGATIVE
Trichomonas: NEGATIVE

## 2019-07-26 LAB — CBC WITH DIFFERENTIAL/PLATELET
Basophils Absolute: 0 10*3/uL (ref 0.0–0.2)
Basos: 1 %
EOS (ABSOLUTE): 0.1 10*3/uL (ref 0.0–0.4)
Eos: 4 %
Hematocrit: 44.1 % (ref 34.0–46.6)
Hemoglobin: 14.3 g/dL (ref 11.1–15.9)
Immature Grans (Abs): 0 10*3/uL (ref 0.0–0.1)
Immature Granulocytes: 0 %
Lymphocytes Absolute: 2.3 10*3/uL (ref 0.7–3.1)
Lymphs: 59 %
MCH: 27.1 pg (ref 26.6–33.0)
MCHC: 32.4 g/dL (ref 31.5–35.7)
MCV: 84 fL (ref 79–97)
Monocytes Absolute: 0.3 10*3/uL (ref 0.1–0.9)
Monocytes: 7 %
Neutrophils Absolute: 1.2 10*3/uL — ABNORMAL LOW (ref 1.4–7.0)
Neutrophils: 29 %
Platelets: 276 10*3/uL (ref 150–450)
RBC: 5.28 x10E6/uL (ref 3.77–5.28)
RDW: 13.4 % (ref 11.7–15.4)
WBC: 4 10*3/uL (ref 3.4–10.8)

## 2019-07-26 LAB — HEMOGLOBIN A1C
Est. average glucose Bld gHb Est-mCnc: 123 mg/dL
Hgb A1c MFr Bld: 5.9 % — ABNORMAL HIGH (ref 4.8–5.6)

## 2019-07-26 LAB — HIV ANTIBODY (ROUTINE TESTING W REFLEX): HIV Screen 4th Generation wRfx: NONREACTIVE

## 2019-07-26 LAB — LIPID PANEL
Chol/HDL Ratio: 5.2 ratio — ABNORMAL HIGH (ref 0.0–4.4)
Cholesterol, Total: 217 mg/dL — ABNORMAL HIGH (ref 100–199)
HDL: 42 mg/dL (ref >39)
LDL Chol Calc (NIH): 152 mg/dL — ABNORMAL HIGH (ref 0–99)
Triglycerides: 128 mg/dL (ref 0–149)
VLDL Cholesterol Cal: 23 mg/dL (ref 5–40)

## 2019-07-26 LAB — RPR: RPR Ser Ql: NONREACTIVE

## 2019-07-26 LAB — TSH: TSH: 1.71 u[IU]/mL (ref 0.450–4.500)

## 2019-07-26 LAB — PROLACTIN: Prolactin: 11.6 ng/mL (ref 4.8–23.3)

## 2019-07-26 LAB — FOLLICLE STIMULATING HORMONE: FSH: 8.2 m[IU]/mL

## 2019-08-08 ENCOUNTER — Encounter: Payer: Self-pay | Admitting: Registered Nurse

## 2019-08-08 NOTE — Telephone Encounter (Signed)
Attempted to call the patient to see which dates she had those vaccines done to enter them in.

## 2019-08-11 ENCOUNTER — Ambulatory Visit (INDEPENDENT_AMBULATORY_CARE_PROVIDER_SITE_OTHER): Payer: 59 | Admitting: Family Medicine

## 2019-08-11 ENCOUNTER — Other Ambulatory Visit: Payer: Self-pay

## 2019-08-11 DIAGNOSIS — Z23 Encounter for immunization: Secondary | ICD-10-CM

## 2019-08-22 ENCOUNTER — Ambulatory Visit: Payer: 59

## 2019-09-08 ENCOUNTER — Encounter: Payer: 59 | Admitting: Family Medicine

## 2019-10-01 ENCOUNTER — Telehealth: Payer: 59 | Admitting: Nurse Practitioner

## 2019-10-01 ENCOUNTER — Other Ambulatory Visit: Payer: Self-pay | Admitting: Nurse Practitioner

## 2019-10-01 DIAGNOSIS — W57XXXA Bitten or stung by nonvenomous insect and other nonvenomous arthropods, initial encounter: Secondary | ICD-10-CM | POA: Diagnosis not present

## 2019-10-01 DIAGNOSIS — R21 Rash and other nonspecific skin eruption: Secondary | ICD-10-CM | POA: Diagnosis not present

## 2019-10-01 MED ORDER — DOXYCYCLINE HYCLATE 100 MG PO TABS: 100.0000 mg | ORAL_TABLET | Freq: Two times a day (BID) | ORAL | 0 refills | Status: DC

## 2019-10-01 NOTE — Progress Notes (Signed)
Thank you for describing your tick bite, Here is how we plan to help! Based on the information that you shared with me it looks like you have An infected or complicated tick bite that requires a longer course of antibiotics and will need for you to schedule a follow-up visit with a provider.  In most cases a tick bite is painless and does not itch.  Most tick bites in which the tick is quickly removed do not require prescriptions. Ticks can transmit several diseases if they are infected and remain attacked to your skin. Therefore the length that the tick was attached and any symptoms you have experienced after the bite are import to accurately develop your custom treatment plan. In most cases a single dose of doxycycline may prevent the development of a more serious condition.  Based on your information I have Provided a home care guide for tick bites and  instructions on when to call for help. and Your symptoms indicate that you need a longer course of antibiotics and a follow up visit with a provider. I have sent doxycycline 100 mg twice a day for 21 days to the pharmacy that you selected. You will need to schedule a follow up visit with your provider. If you do not have a primary care provider you may use our telehealth physicians on the web at [email or web address]  Which ticks  are associated with illness?  The Wood Tick (dog tick) is the size of a watermelon seed and can sometimes transmit Rocky Mountain spotted fever and [place] tick fever.   The Deer Tick (black-legged tick) is between the size of a poppy seed (pin head) and an apple seed, and can sometimes transmit Lyme disease.  A brown to black tick with a white splotch on its back is likely a female Amblyomma americanum ([place] Star tick). This tick has been associated with Southern Tick Associated illness ( STARI)  Lyme disease has become the most common tick-borne illness in the [place]. The risk of Lyme disease following a  recognized deer tick bite is estimated to be 1%.  The majority of cases of Lyme disease start with a bull's eye rash at the site of the tick bite. The rash can occur days to weeks (typically 7-10 days) after a tick bite. Treatment with antibiotics is indicated if this rash appears. Flu-like symptoms may accompany the rash, including: fever, chills, headaches, muscle aches, and fatigue. Removing ticks promptly may prevent tick borne disease.  What can be used to prevent Tick Bites?   Insect repellant with at leas 20% DEET.  Wearing long pants with sock and shoes.  Avoiding tall grass and heavily wooded areas.  Checking your skin after being outdoors.  Shower with a washcloth after outdoor exposures.  HOME CARE ADVICE FOR TICK BITE  1. Wood Tick Removal:  o Use a pair of tweezers and grasp the wood tick close to the skin (on its head). Pull the wood tick straight upward without twisting or crushing it. Maintain a steady pressure until it releases its grip.   o If tweezers aren't available, use fingers, a loop of thread around the jaws, or a needle between the jaws for traction.  o Note: covering the tick with petroleum jelly, nail polish or rubbing alcohol doesn't work. Neither does touching the tick with a hot or cold object. 2. Tiny Deer Tick Removal:   o Needs to be scraped off with a knife blade or credit card edge. o Place tick   in a sealed container (e.g. glass jar, zip lock plastic bag), in case your doctor wants to see it. 3. Tick's Head Removal:  o If the wood tick's head breaks off in the skin, it must be removed. Clean the skin. Then use a sterile needle to uncover the head and lift it out or scrape it off.  o If a very small piece of the head remains, the skin will eventually slough it off. 4. Antibiotic Ointment:  o Wash the wound and your hands with soap and water after removal to prevent catching any tick disease.  Apply an over the counter antibiotic ointment (e.g.  bacitracin) to the bite once. 5. Expected Course: Tick bites normally don't itch or hurt. That's why they often go unnoticed. 6. Call Your Doctor If:  o You can't remove the tick or the tick's head o Fever, a severe head ache, or rash occur in the next 2 weeks o Bite begins to look infected o Lyme's disease is common in your area o You have not had a tetanus in the last 10 years o Your current symptoms become worse    MAKE SURE YOU   Understand these instructions.  Will watch your condition.  Will get help right away if you are not doing well or get worse.   Thank you for choosing an e-visit.  Your e-visit answers were reviewed by a board certified advanced clinical practitioner to complete your personal care plan. Depending upon the condition, your plan could have included both over the counter or prescription medications. Please review your pharmacy choice. If there is a problem you may use MyChart messaging to have the prescription routed to another pharmacy. Your safety is important to us. If you have drug allergies check your prescription carefully.   You can use MyChart to ask questions about today's visit, request a non-urgent call back, or ask for a work or school excuse for 24 hours related to this e-Visit. If it has been greater than 24 hours you will need to follow up with your provider, or enter a new e-Visit to address those concerns.  You will get an email in the next two days asking about your experience. I hope  that your e-visit has been valuable and will speed your recovery   5-10 minutes spent reviewing and documenting in chart.  

## 2019-12-30 ENCOUNTER — Other Ambulatory Visit: Payer: Self-pay

## 2019-12-30 ENCOUNTER — Ambulatory Visit (INDEPENDENT_AMBULATORY_CARE_PROVIDER_SITE_OTHER): Payer: 59

## 2019-12-30 ENCOUNTER — Encounter: Payer: Self-pay | Admitting: Registered Nurse

## 2019-12-30 ENCOUNTER — Ambulatory Visit (INDEPENDENT_AMBULATORY_CARE_PROVIDER_SITE_OTHER): Payer: 59 | Admitting: Registered Nurse

## 2019-12-30 VITALS — BP 119/81 | HR 71 | Temp 97.7°F | Ht 65.0 in | Wt 215.0 lb

## 2019-12-30 DIAGNOSIS — Z1159 Encounter for screening for other viral diseases: Secondary | ICD-10-CM

## 2019-12-30 DIAGNOSIS — G8929 Other chronic pain: Secondary | ICD-10-CM | POA: Diagnosis not present

## 2019-12-30 DIAGNOSIS — M545 Low back pain: Secondary | ICD-10-CM

## 2019-12-30 DIAGNOSIS — M549 Dorsalgia, unspecified: Secondary | ICD-10-CM

## 2019-12-30 DIAGNOSIS — M542 Cervicalgia: Secondary | ICD-10-CM | POA: Diagnosis not present

## 2019-12-30 DIAGNOSIS — Z23 Encounter for immunization: Secondary | ICD-10-CM

## 2019-12-30 MED ORDER — METHOCARBAMOL 500 MG PO TABS: 500.0000 mg | ORAL_TABLET | Freq: Four times a day (QID) | ORAL | 0 refills | Status: DC

## 2019-12-30 MED ORDER — MELOXICAM 7.5 MG PO TABS: 7.5000 mg | ORAL_TABLET | Freq: Every day | ORAL | 0 refills | Status: DC

## 2019-12-30 NOTE — Progress Notes (Signed)
Acute Office Visit  Subjective:    Patient ID: Breanna Valenzuela, female    DOB: Jan 11, 1981, 39 y.o.   MRN: [id number]  Chief Complaint  Patient presents with  . Joint Swelling    Pt stated that she has been experiencing both ankle swelling over the past few months and it is mostly Lt  . 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 Usually on work days. 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 Sometimes mild pain and tingling. Does not note pitting. Nails, hair, and skin intact on lower extremities. No redness, heat, warmth, or unilateral worsening. No hx of dvt  Back pain: c spine and L spine. 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 Worsening lately - especially since CPE earlier this year. Having spinal tenderness over C3-4-5-6 and L3-4-5-S1. Denies sciatic pain or radiculopathy. No saddle anesthesia. No new or different headaches.  Otherwise feeling well.   Past Medical History:  Diagnosis Date  . Allergy   . Pap smear abnormality of cervix     History reviewed. No pertinent surgical history.  Family History  Problem Relation Age of Onset  . Diabetes Mother   . Hyperlipidemia Mother   . Mental illness Brother   . Hyperlipidemia Brother   . Mental illness Brother     Social History   Socioeconomic History  . Marital status: Single    Spouse name: Not on file  . Number of children: Not on file  . Years of education: Not on file  . Highest education level: Not on file  Occupational History  . Not on file  Tobacco Use  . Smoking status: Former Games developer  . Smokeless tobacco: Never Used  Substance and Sexual Activity  . Alcohol use: Yes    Alcohol/week: 0.0 standard drinks    Comment: occ  . Drug use: No  . Sexual activity: Not on file  Other Topics Concern  . Not on file  Social History Narrative  . Not on  file   Social Determinants of Health   Financial Resource Strain:   . Difficulty of Paying Living Expenses: Not on file  Food Insecurity:   . Worried About Programme researcher, broadcasting/film/video in the Last Year: Not on file  . Ran Out of Food in the Last Year: Not on file  Transportation Needs:   . Lack of Transportation (Medical): Not on file  . Lack of Transportation (Non-Medical): Not on file  Physical Activity:   . Days of Exercise per Week: Not on file  . Minutes of Exercise per Session: Not on file  Stress:   . Feeling of Stress : Not on file  Social Connections:   . Frequency of Communication with Friends and Family: Not on file  . Frequency of Social Gatherings with Friends and Family: Not on file  . Attends Religious Services: Not on file  . Active Member of Clubs or Organizations: Not on file  . Attends Banker Meetings: Not on file  . Marital Status: Not on file  Intimate Partner Violence:   . Fear of Current or Ex-Partner: Not on file  . Emotionally Abused: Not on file  . Physically Abused: Not on file  . Sexually Abused: Not on file  Outpatient Medications Prior to Visit  Medication Sig Dispense Refill  . diphenhydrAMINE (BENADRYL) 25 mg capsule Take 25 mg by mouth every 6 (six) hours as needed for allergies.    Marland Kitchen doxycycline (VIBRA-TABS) 100 MG tablet Take 1 tablet (100 mg total) by mouth 2 (two) times daily. 1 po bid 42 tablet 0   No facility-administered medications prior to visit.    Allergies  Allergen Reactions  . Oxycodone Nausea And Vomiting and Other (See Comments)    Patient gets delirious, says its too strong   . Tomato Itching, Swelling and Rash  . Pineapple Itching, Swelling and Rash    Review of Systems  Constitutional: Negative.   HENT: Negative.   Eyes: Negative.   Respiratory: Negative.   Cardiovascular: Positive for leg swelling. Negative for chest pain and palpitations.  Gastrointestinal: Negative.   Endocrine: Negative.     Genitourinary: Negative.   Musculoskeletal: Positive for back pain. Negative for arthralgias, gait problem, joint swelling, myalgias, neck pain and neck stiffness.  Skin: Negative.   Allergic/Immunologic: Negative.   Neurological: Negative.   Psychiatric/Behavioral: Negative.   All other systems reviewed and are negative.      Objective:    Physical Exam Vitals and nursing note reviewed.  Constitutional:      General: She is not in acute distress.    Appearance: Normal appearance. She is normal weight. She is not ill-appearing, toxic-appearing or diaphoretic.  Cardiovascular:     Rate and Rhythm: Normal rate and regular rhythm.     Heart sounds: Normal heart sounds.  Pulmonary:     Effort: Pulmonary effort is normal. No respiratory distress.     Breath sounds: Normal breath sounds.  Musculoskeletal:        General: Tenderness (c spine and l spine) present. No signs of injury.     Right lower leg: Edema (mild, nonpitting) present.     Left lower leg: Edema (mild, nonpitting) present.  Skin:    General: Skin is warm and dry.     Capillary Refill: Capillary refill takes less than 2 seconds.     Coloration: Skin is not jaundiced or pale.     Findings: No bruising, erythema, lesion or rash.  Neurological:     General: No focal deficit present.     Mental Status: She is alert and oriented to person, place, and time. Mental status is at baseline.  Psychiatric:        Mood and Affect: Mood normal.        Behavior: Behavior normal.        Thought Content: Thought content normal.        Judgment: Judgment normal.     BP 119/81 (BP Location: Right Arm, Patient Position: Sitting, Cuff Size: Normal)   Pulse 71   Temp 97.7 F (36.5 C) (Temporal)   Ht 5\' 5"  (1.651 m)   Wt 215 lb (97.5 kg)   LMP  (LMP Unknown)   SpO2 98%   BMI 35.78 kg/m  Wt Readings from Last 3 Encounters:  12/30/19 215 lb (97.5 kg)  07/25/19 220 lb (99.8 kg)  07/10/15 210 lb (95.3 kg)    Health  Maintenance Due  Topic Date Due  . Hepatitis C Screening  Never done    There are no preventive care reminders to display for this patient.   Lab Results  Component Value Date   TSH 1.710 07/25/2019   Lab Results  Component Value Date   WBC 4.0 07/25/2019  HGB 14.3 07/25/2019   HCT 44.1 07/25/2019   MCV 84 07/25/2019   PLT 276 07/25/2019   Lab Results  Component Value Date   NA 139 07/25/2019   K 3.9 07/25/2019   CO2 24 07/25/2019   GLUCOSE 83 07/25/2019   BUN 10 07/25/2019   CREATININE 0.73 07/25/2019   BILITOT 0.3 07/25/2019   ALKPHOS 76 07/25/2019   AST 16 07/25/2019   ALT 18 07/25/2019   PROT 7.8 07/25/2019   ALBUMIN 4.3 07/25/2019   CALCIUM 9.5 07/25/2019   Lab Results  Component Value Date   CHOL 217 (H) 07/25/2019   Lab Results  Component Value Date   HDL 42 07/25/2019   Lab Results  Component Value Date   LDLCALC 152 (H) 07/25/2019   Lab Results  Component Value Date   TRIG 128 07/25/2019   Lab Results  Component Value Date   CHOLHDL 5.2 (H) 07/25/2019   Lab Results  Component Value Date   HGBA1C 5.9 (H) 07/25/2019       Assessment & Plan:   Problem List Items Addressed This Visit    None    Visit Diagnoses    Need for prophylactic vaccination and inoculation against influenza    -  Primary   Relevant Orders   Flu Vaccine QUAD 36+ mos IM (Completed)   Encounter for hepatitis C screening test for low risk patient       Tenderness over spine       Relevant Orders   DG Cervical Spine Complete   DG Lumbar Spine Complete   Chronic midline low back pain without sciatica       Relevant Orders   DG Lumbar Spine Complete   Chronic neck pain       Relevant Orders   DG Cervical Spine Complete       No orders of the defined types were placed in this encounter.  PLAN  c spine xray shows no acute changes. Loss of lordosis. Feel this is likely bilat trapezius strain. Will give meloxicam and methocarbamol for PRN use  Lumbar spine  xray wnl. 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 Will send to PT  Mild lower extremity edema appears to likely be start of some pvd. Recommend compression stockings, improving diet, routine activity outside of work.   Patient encouraged to call clinic with any questions, comments, or concerns.   Janeece Agee, NP

## 2019-12-30 NOTE — Patient Instructions (Signed)
° ° ° °  If you have lab work done today you will be contacted with your lab results within the next 2 weeks.  If you have not heard from us then please contact us. The fastest way to get your results is to register for My Chart. ° ° °IF you received an x-ray today, you will receive an invoice from [facility]. Please contact  Radiology at [phone] with questions or concerns regarding your invoice.  ° °IF you received labwork today, you will receive an invoice from [facility]. Please contact [facility] at [phone] with questions or concerns regarding your invoice.  ° °Our billing staff will not be able to assist you with questions regarding bills from these companies. ° °You will be contacted with the lab results as soon as they are available. The fastest way to get your results is to activate your My Chart account. Instructions are located on the last page of this paperwork. If you have not heard from us regarding the results in 2 weeks, please contact this office. °  ° ° ° °

## 2020-01-29 ENCOUNTER — Telehealth: Payer: 59 | Admitting: Nurse Practitioner

## 2020-01-29 DIAGNOSIS — N3000 Acute cystitis without hematuria: Secondary | ICD-10-CM

## 2020-01-29 MED ORDER — CEPHALEXIN 500 MG PO CAPS: 500.0000 mg | ORAL_CAPSULE | Freq: Two times a day (BID) | ORAL | 0 refills | Status: DC

## 2020-01-29 NOTE — Progress Notes (Signed)

## 2020-02-15 ENCOUNTER — Ambulatory Visit (INDEPENDENT_AMBULATORY_CARE_PROVIDER_SITE_OTHER): Payer: 59 | Admitting: Registered Nurse

## 2020-02-15 ENCOUNTER — Other Ambulatory Visit: Payer: Self-pay

## 2020-02-15 DIAGNOSIS — Z111 Encounter for screening for respiratory tuberculosis: Secondary | ICD-10-CM

## 2020-02-15 NOTE — Patient Instructions (Signed)
°  Tuberculosis Risk Questionnaire  1. No Were you born outside the [place] in one of the following parts of the world: [place], [place], [place], [place] or [place]?    2. No Have you traveled outside the [place] and lived for more than one month in one of the following parts of the world: [place], [place], [place], [place] or [place]?    3. No Do you have a compromised immune system such as from any of the following conditions:HIV/AIDS, organ or bone marrow transplantation, diabetes, immunosuppressive medicines (e.g. Prednisone, Remicaide), leukemia, lymphoma, cancer of the head or neck, gastrectomy or jejunal bypass, end-stage renal disease (on dialysis), or silicosis?     4. YES Have you ever or do you plan on working in: a residential care center, a health care facility, a jail or prison or homeless shelter?    5. No Have you ever: injected illegal drugs, used crack cocaine, lived in a homeless shelter  or been in jail or prison?     6. No Have you ever been exposed to anyone with infectious tuberculosis?  7. No Have you ever had a BCG vaccine? (BCG is a vaccine for tuberculosis  (TB) used in OTHER countries, NOT in the [place]).  8. No Have you ever been advised by a health care provider NOT to have a TB skin test?  9. No Have you ever had a POSITIVE TB skin test?  IF SO, when? n/a  IF SO, were you treated with INH? n/a  IF SO, where? n/a  Tuberculosis Symptom Questionnaire  Do you currently have any of the following symptoms?  1. No Unexplained cough lasting more than 3 weeks?   2. No Unexplained fever lasting more than 3 weeks.   3. No Night Sweats (sweating that leaves the bedclothes and sheets wet)     4. No Shortness of Breath   5. No Chest Pain   6. No Unintentional weight loss    7. No Unexplained fatigue (very tired for no reason)

## 2020-02-17 ENCOUNTER — Ambulatory Visit: Payer: 59

## 2020-03-20 ENCOUNTER — Emergency Department (HOSPITAL_COMMUNITY): Payer: 59

## 2020-03-20 ENCOUNTER — Emergency Department (HOSPITAL_COMMUNITY)
Admission: EM | Admit: 2020-03-20 | Discharge: 2020-03-20 | Disposition: A | Discharge status: Home / Self Care | Payer: 59 | Attending: Emergency Medicine | Admitting: Emergency Medicine

## 2020-03-20 ENCOUNTER — Other Ambulatory Visit: Payer: Self-pay

## 2020-03-20 ENCOUNTER — Encounter (HOSPITAL_COMMUNITY): Payer: Self-pay

## 2020-03-20 DIAGNOSIS — R7989 Other specified abnormal findings of blood chemistry: Secondary | ICD-10-CM | POA: Insufficient documentation

## 2020-03-20 DIAGNOSIS — Z87891 Personal history of nicotine dependence: Secondary | ICD-10-CM | POA: Insufficient documentation

## 2020-03-20 DIAGNOSIS — M545 Low back pain, unspecified: Secondary | ICD-10-CM | POA: Insufficient documentation

## 2020-03-20 DIAGNOSIS — R519 Headache, unspecified: Secondary | ICD-10-CM | POA: Insufficient documentation

## 2020-03-20 DIAGNOSIS — M7918 Myalgia, other site: Secondary | ICD-10-CM

## 2020-03-20 DIAGNOSIS — Y92410 Unspecified street and highway as the place of occurrence of the external cause: Secondary | ICD-10-CM | POA: Insufficient documentation

## 2020-03-20 DIAGNOSIS — M791 Myalgia, unspecified site: Secondary | ICD-10-CM | POA: Diagnosis not present

## 2020-03-20 DIAGNOSIS — M542 Cervicalgia: Secondary | ICD-10-CM

## 2020-03-20 DIAGNOSIS — M549 Dorsalgia, unspecified: Secondary | ICD-10-CM

## 2020-03-20 MED ORDER — HYDROCODONE-ACETAMINOPHEN 5-325 MG PO TABS
1.0000 | ORAL_TABLET | Freq: Once | ORAL | Status: AC
  Administered 2020-03-20: 1 via ORAL
  Filled 2020-03-20 (×2): qty 1

## 2020-03-20 MED ORDER — CYCLOBENZAPRINE HCL 10 MG PO TABS: 10.0000 mg | ORAL_TABLET | Freq: Two times a day (BID) | ORAL | 0 refills | Status: DC | PRN
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
## 2020-03-25 ENCOUNTER — Telehealth: Payer: 59 | Admitting: Family

## 2020-03-25 ENCOUNTER — Telehealth: Payer: 59 | Admitting: Nurse Practitioner

## 2020-03-25 DIAGNOSIS — B370 Candidal stomatitis: Secondary | ICD-10-CM

## 2020-03-25 DIAGNOSIS — M545 Low back pain, unspecified: Secondary | ICD-10-CM

## 2020-03-25 MED ORDER — ETODOLAC 300 MG PO CAPS: 300.0000 mg | ORAL_CAPSULE | Freq: Two times a day (BID) | ORAL | 0 refills | Status: AC

## 2020-03-25 MED ORDER — NYSTATIN 100000 UNIT/ML MT SUSP: 5.0000 mL | Freq: Four times a day (QID) | OROMUCOSAL | 0 refills | Status: DC

## 2020-03-25 NOTE — Progress Notes (Signed)
We are sorry that you are not feeling well.  Here is how we plan to help!  Based on what you have shared with me it looks like you mostly have acute back pain.  Acute back pain is defined as musculoskeletal pain that can resolve in 1-3 weeks with conservative treatment.  I have prescribed Etodolac 300 mg take one by mouth twice a day non-steroid anti-inflammatory (NSAID) You should continue the cyclobenzaprine/Flexeril prescribed on 03/20/20. Some patients experience stomach irritation or in increased heartburn with anti-inflammatory drugs.  Please keep in mind that muscle relaxer's can cause fatigue and should not be taken while at work or driving.  Back pain is very common.  The pain often gets better over time.  The cause of back pain is usually not dangerous.  Most people can learn to manage their back pain on their own.  Home Care  Stay active.  Start with short walks on flat ground if you can.  Try to walk farther each day.  Do not sit, drive or stand in one place for more than 30 minutes.  Do not stay in bed.  Do not avoid exercise or work.  Activity can help your back heal faster.  Be careful when you bend or lift an object.  Bend at your knees, keep the object close to you, and do not twist.  Sleep on a firm mattress.  Lie on your side, and bend your knees.  If you lie on your back, put a pillow under your knees.  Only take medicines as told by your doctor.  Put ice on the injured area.  Put ice in a plastic bag  Place a towel between your skin and the bag  Leave the ice on for 15-20 minutes, 3-4 times a day for the first 2-3 days. 210 After that, you can switch between ice and heat packs.  Ask your doctor about back exercises or massage.  Avoid feeling anxious or stressed.  Find good ways to deal with stress, such as exercise.  Get Help Right Way If:  Your pain does not go away with rest or medicine.  Your pain does not go away in 1 week.  You have new  problems.  You do not feel well.  The pain spreads into your legs.  You cannot control when you poop (bowel movement) or pee (urinate)  You feel sick to your stomach (nauseous) or throw up (vomit)  You have belly (abdominal) pain.  You feel like you may pass out (faint).  If you develop a fever.  Make Sure you:  Understand these instructions.  Will watch your condition  Will get help right away if you are not doing well or get worse.  Your e-visit answers were reviewed by a board certified advanced clinical practitioner to complete your personal care plan.  Depending on the condition, your plan could have included both over the counter or prescription medications.  If there is a problem please reply  once you have received a response from your provider.  Your safety is important to [place].  If you have drug allergies check your prescription carefully.    You can use MyChart to ask questions about today's visit, request a non-urgent call back, or ask for a work or school excuse for 24 hours related to this e-Visit. If it has been greater than 24 hours you will need to follow up with your provider, or enter a new e-Visit to address those concerns.  You will  get an e-mail in the next two days asking about your experience.  I hope that your e-visit has been valuable and will speed your recovery. Thank you for using e-visits.  I have spent at least 5 minutes reviewing and documenting in the patient's chart.

## 2020-03-25 NOTE — Progress Notes (Signed)
E Visit for Rash  We are sorry that you are not feeling well. Here is how we plan to help!  Based upon your presentation it appears you have a fungal infection.  I have prescribed: Nystatin mouthwash that you swish and spit four times a day.    HOME CARE:   Take cool showers and avoid direct sunlight.  Apply cool compress or wet dressings.  Take a bath in an oatmeal bath.  Sprinkle content of one Aveeno packet under running faucet with comfortably warm water.  Bathe for 15-20 minutes, 1-2 times daily.  Pat dry with a towel. Do not rub the rash.  Use hydrocortisone cream.  Take an antihistamine like Benadryl for widespread rashes that itch.  The adult dose of Benadryl is 25-50 mg by mouth 4 times daily.  Caution:  This type of medication may cause sleepiness.  Do not drink alcohol, drive, or operate dangerous machinery while taking antihistamines.  Do not take these medications if you have prostate enlargement.  Read package instructions thoroughly on all medications that you take.  GET HELP RIGHT AWAY IF:   Symptoms don't go away after treatment.  Severe itching that persists.  If you rash spreads or swells.  If you rash begins to smell.  If it blisters and opens or develops a yellow-brown crust.  You develop a fever.  You have a sore throat.  You become short of breath.  MAKE SURE YOU:  Understand these instructions. Will watch your condition. Will get help right away if you are not doing well or get worse.  Thank you for choosing an e-visit. Your e-visit answers were reviewed by a board certified advanced clinical practitioner to complete your personal care plan. Depending upon the condition, your plan could have included both over the counter or prescription medications. Please review your pharmacy choice. Be sure that the pharmacy you have chosen is open so that you can pick up your prescription now.  If there is a problem you may message your provider in MyChart  to have the prescription routed to another pharmacy. Your safety is important to [place]. If you have drug allergies check your prescription carefully.  For the next 24 hours, you can use MyChart to ask questions about today's visit, request a non-urgent call back, or ask for a work or school excuse from your e-visit provider. You will get an email in the next two days asking about your experience. I hope that your e-visit has been valuable and will speed your recovery.  Approximately 5 minutes was spent documenting and reviewing patient's chart.

## 2020-03-26 ENCOUNTER — Emergency Department (HOSPITAL_BASED_OUTPATIENT_CLINIC_OR_DEPARTMENT_OTHER)
Admission: EM | Admit: 2020-03-26 | Discharge: 2020-03-26 | Disposition: A | Discharge status: Home / Self Care | Payer: 59 | Attending: Emergency Medicine | Admitting: Emergency Medicine

## 2020-03-26 ENCOUNTER — Other Ambulatory Visit: Payer: Self-pay

## 2020-03-26 ENCOUNTER — Telehealth: Payer: 59 | Admitting: Physician Assistant

## 2020-03-26 ENCOUNTER — Emergency Department (HOSPITAL_BASED_OUTPATIENT_CLINIC_OR_DEPARTMENT_OTHER): Payer: 59

## 2020-03-26 ENCOUNTER — Encounter (HOSPITAL_BASED_OUTPATIENT_CLINIC_OR_DEPARTMENT_OTHER): Payer: Self-pay

## 2020-03-26 DIAGNOSIS — R519 Headache, unspecified: Secondary | ICD-10-CM

## 2020-03-26 DIAGNOSIS — Z20822 Contact with and (suspected) exposure to covid-19: Secondary | ICD-10-CM | POA: Insufficient documentation

## 2020-03-26 DIAGNOSIS — H539 Unspecified visual disturbance: Secondary | ICD-10-CM

## 2020-03-26 DIAGNOSIS — Z87891 Personal history of nicotine dependence: Secondary | ICD-10-CM | POA: Diagnosis not present

## 2020-03-26 LAB — RESP PANEL BY RT-PCR (FLU A&B, COVID) ARPGX2
Influenza A by PCR: NEGATIVE
Influenza B by PCR: NEGATIVE
SARS Coronavirus 2 by RT PCR: NEGATIVE

## 2020-03-26 MED ORDER — METOCLOPRAMIDE HCL 5 MG/ML IJ SOLN
10.0000 mg | Freq: Once | INTRAMUSCULAR | Status: AC
  Administered 2020-03-26: 17:00:00 10 mg via INTRAVENOUS
  Filled 2020-03-26: qty 2

## 2020-03-26 MED ORDER — KETOROLAC TROMETHAMINE 15 MG/ML IJ SOLN
15.0000 mg | Freq: Once | INTRAMUSCULAR | Status: AC
  Administered 2020-03-26: 19:00:00 15 mg via INTRAVENOUS
  Filled 2020-03-26: qty 1

## 2020-03-26 MED ORDER — DEXAMETHASONE SODIUM PHOSPHATE 4 MG/ML IJ SOLN
4.0000 mg | Freq: Once | INTRAMUSCULAR | Status: AC
  Administered 2020-03-26: 17:00:00 4 mg via INTRAVENOUS
  Filled 2020-03-26: qty 1

## 2020-03-26 MED ORDER — DIPHENHYDRAMINE HCL 50 MG/ML IJ SOLN
50.0000 mg | Freq: Once | INTRAMUSCULAR | Status: AC
  Administered 2020-03-26: 17:00:00 50 mg via INTRAVENOUS
  Filled 2020-03-26: qty 1
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
## 2020-03-26 NOTE — Discharge Instructions (Addendum)
Your workup was reassuring in the ED today. Your CT scan did not show any acute abnormalities.   Please follow up with your PCP regarding your ED visit today  Return to the ED IMMEDIATELY for any worsening symptoms

## 2020-03-26 NOTE — ED Notes (Signed)
sr x 2 up, call bell within reach, pt instructed not to get up without staff in room

## 2020-03-26 NOTE — Progress Notes (Signed)
Based on what you shared with me, I feel your condition warrants further evaluation and I recommend that you be seen for a face to face office visit. Headache with vision changes are very concerning.  Please be evaluated in the emergency department or at one of the listed urgent care locations TODAY.   NOTE: If you entered your credit card information for this eVisit, you will not be charged. You may see a "hold" on your card for the $35 but that hold will drop off and you will not have a charge processed.   If you are having a true medical emergency please call 911.      For an urgent face to face visit, [facility] has five urgent care centers for your convenience:     [facility] at [facility] Directions [phone] [address] [place], [place] [address] . 10 am - 6pm Monday - Friday    [facility]) Get Driving Directions [phone] [address] [place], [place] [address] . 10 am to 8 pm Monday-Friday . 12 pm to 8 pm [facility] at [facility] Get Driving Directions [phone] [address] [place], [place] [address] . 8 am to 8 pm Monday-Friday . 9 am to 6 pm Saturday . 11 am to 6 pm Sunday     [facility] at [facility] Get Driving Directions  [phone] [address] [place], [place] [address] . 8 am to 8 pm Monday-Friday . 8 am to 4 pm [facility] at [facility] Directions [phone] [address] [place], [place] [address] . 12 pm to 6 pm Monday-Friday      Your e-visit answers were reviewed by a board certified advanced clinical practitioner to complete your personal care plan.  Thank you for using e-Visits.

## 2020-03-26 NOTE — ED Notes (Signed)
Presents with HA for the past few days intermittently, early this am became worse. Having some light sensitivity, sometimes has decrease peripheral visions. Having very mild nausea intermittently as well

## 2020-03-26 NOTE — ED Notes (Signed)
Comfort measures provided, warm blanket provided, lights dimmed for comfort

## 2020-03-26 NOTE — ED Notes (Signed)
BEFAST / VAN negative 
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
## 2020-03-26 NOTE — ED Notes (Signed)
ED Provider at bedside. 
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
## 2020-04-02 ENCOUNTER — Telehealth: Payer: 59 | Admitting: Nurse Practitioner

## 2020-04-02 ENCOUNTER — Other Ambulatory Visit: Payer: Self-pay

## 2020-04-02 ENCOUNTER — Ambulatory Visit (HOSPITAL_COMMUNITY)
Admission: EM | Admit: 2020-04-02 | Discharge: 2020-04-02 | Disposition: A | Discharge status: Home / Self Care | Payer: 59

## 2020-04-02 ENCOUNTER — Ambulatory Visit: Payer: Self-pay | Admitting: *Deleted

## 2020-04-02 DIAGNOSIS — M545 Low back pain, unspecified: Secondary | ICD-10-CM

## 2020-04-02 NOTE — Progress Notes (Signed)
We are sorry that you are not feeling well.  Here is how we plan to help!  Based on what you have shared with me it looks like you mostly have acute back pain.  Acute back pain is defined as musculoskeletal pain that can resolve in 1-3 weeks with conservative treatment.  Only requesting work note.   Home Care  Stay active.  Start with short walks on flat ground if you can.  Try to walk farther each day.  Do not sit, drive or stand in one place for more than 30 minutes.  Do not stay in bed.  Do not avoid exercise or work.  Activity can help your back heal faster.  Be careful when you bend or lift an object.  Bend at your knees, keep the object close to you, and do not twist.  Sleep on a firm mattress.  Lie on your side, and bend your knees.  If you lie on your back, put a pillow under your knees.  Only take medicines as told by your doctor.  Put ice on the injured area.  Put ice in a plastic bag  Place a towel between your skin and the bag  Leave the ice on for 15-20 minutes, 3-4 times a day for the first 2-3 days. 210 After that, you can switch between ice and heat packs.  Ask your doctor about back exercises or massage.  Avoid feeling anxious or stressed.  Find good ways to deal with stress, such as exercise.  Get Help Right Way If:  Your pain does not go away with rest or medicine.  Your pain does not go away in 1 week.  You have new problems.  You do not feel well.  The pain spreads into your legs.  You cannot control when you poop (bowel movement) or pee (urinate)  You feel sick to your stomach (nauseous) or throw up (vomit)  You have belly (abdominal) pain.  You feel like you may pass out (faint).  If you develop a fever.  Make Sure you:  Understand these instructions.  Will watch your condition  Will get help right away if you are not doing well or get worse.  Your e-visit answers were reviewed by a board certified advanced clinical practitioner  to complete your personal care plan.  Depending on the condition, your plan could have included both over the counter or prescription medications.  If there is a problem please reply  once you have received a response from your provider.  Your safety is important to [place].  If you have drug allergies check your prescription carefully.    You can use MyChart to ask questions about today's visit, request a non-urgent call back, or ask for a work or school excuse for 24 hours related to this e-Visit. If it has been greater than 24 hours you will need to follow up with your provider, or enter a new e-Visit to address those concerns.  You will get an e-mail in the next two days asking about your experience.  I hope that your e-visit has been valuable and will speed your recovery. Thank you for using e-visits.  5-10 minutes spent reviewing and documenting in chart.

## 2020-04-02 NOTE — Telephone Encounter (Signed)
I returned pt's call and got her on the 3rd attempt.  She needed a note for her job.  She was able to pull it up on MyChart but now she can't get the note from the E visit via MyChart to come back up.  I gave her the MyChart help desk number.    "I already have an appt with "Dr. Kateri Plummer" on Wed. So the other stuff is taken care of".

## 2020-04-02 NOTE — Telephone Encounter (Signed)
This encounter was created in error - please disregard.

## 2020-04-04 ENCOUNTER — Encounter: Payer: Self-pay | Admitting: Registered Nurse

## 2020-04-04 ENCOUNTER — Other Ambulatory Visit: Payer: Self-pay

## 2020-04-04 ENCOUNTER — Ambulatory Visit (INDEPENDENT_AMBULATORY_CARE_PROVIDER_SITE_OTHER): Payer: 59 | Admitting: Registered Nurse

## 2020-04-04 VITALS — BP 101/68 | HR 79 | Temp 98.0°F | Resp 18 | Ht 65.0 in | Wt 213.4 lb

## 2020-04-04 DIAGNOSIS — G44229 Chronic tension-type headache, not intractable: Secondary | ICD-10-CM

## 2020-04-04 DIAGNOSIS — S060X0A Concussion without loss of consciousness, initial encounter: Secondary | ICD-10-CM

## 2020-04-04 MED ORDER — AMITRIPTYLINE HCL 10 MG PO TABS: 10.0000 mg | ORAL_TABLET | Freq: Every day | ORAL | 0 refills | Status: DC

## 2020-04-04 NOTE — Patient Instructions (Signed)
° ° ° °  If you have lab work done today you will be contacted with your lab results within the next 2 weeks.  If you have not heard from us then please contact us. The fastest way to get your results is to register for My Chart. ° ° °IF you received an x-ray today, you will receive an invoice from [facility]. Please contact  Radiology at [phone] with questions or concerns regarding your invoice.  ° °IF you received labwork today, you will receive an invoice from [facility]. Please contact [facility] at [phone] with questions or concerns regarding your invoice.  ° °Our billing staff will not be able to assist you with questions regarding bills from these companies. ° °You will be contacted with the lab results as soon as they are available. The fastest way to get your results is to activate your My Chart account. Instructions are located on the last page of this paperwork. If you have not heard from us regarding the results in 2 weeks, please contact this office. °  ° ° ° °
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
## 2020-04-05 ENCOUNTER — Telehealth: Payer: Self-pay | Admitting: Physical Therapy

## 2020-04-05 NOTE — Telephone Encounter (Signed)
Initial concussion visit scheduled for 04/10/2020.

## 2020-04-05 NOTE — Telephone Encounter (Signed)
Called pt and left message to return call if she would like to schedule a concussion initial eval.  Please schedule pt in a 30 min slot between 8 am - 3:30 pm.
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
## 2020-04-10 ENCOUNTER — Ambulatory Visit: Payer: 59 | Admitting: Family Medicine

## 2020-04-11 ENCOUNTER — Ambulatory Visit: Payer: 59 | Admitting: Family Medicine
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
## 2020-04-12 ENCOUNTER — Ambulatory Visit: Payer: 59 | Admitting: Family Medicine

## 2020-04-12 ENCOUNTER — Encounter: Payer: Self-pay | Admitting: Family Medicine

## 2020-04-16 ENCOUNTER — Encounter: Payer: Self-pay | Admitting: Registered Nurse

## 2020-04-18 ENCOUNTER — Ambulatory Visit: Payer: 59 | Admitting: Registered Nurse

## 2020-04-19 ENCOUNTER — Encounter: Payer: Self-pay | Admitting: Registered Nurse

## 2020-04-19 NOTE — Telephone Encounter (Signed)
Left msg for pt to call to schedule MyChart Virtual visit. Will let you know when scheduled.

## 2020-04-24 NOTE — Telephone Encounter (Signed)
PT scheduled an appt via MyChart for 1/19. Called her but VM full. Sent her a MyChart message to confirm if this is in office or Virtual.

## 2020-04-25 ENCOUNTER — Telehealth (INDEPENDENT_AMBULATORY_CARE_PROVIDER_SITE_OTHER): Payer: 59 | Admitting: Family Medicine

## 2020-04-25 ENCOUNTER — Ambulatory Visit: Payer: Self-pay | Admitting: Registered Nurse

## 2020-04-25 ENCOUNTER — Ambulatory Visit: Payer: 59

## 2020-04-25 DIAGNOSIS — F32A Depression, unspecified: Secondary | ICD-10-CM | POA: Insufficient documentation

## 2020-04-25 DIAGNOSIS — S060X0A Concussion without loss of consciousness, initial encounter: Secondary | ICD-10-CM | POA: Diagnosis not present

## 2020-04-25 DIAGNOSIS — F4310 Post-traumatic stress disorder, unspecified: Secondary | ICD-10-CM | POA: Insufficient documentation

## 2020-04-25 MED ORDER — NORTRIPTYLINE HCL 25 MG PO CAPS: 25.0000 mg | ORAL_CAPSULE | Freq: Every day | ORAL | 2 refills | Status: DC
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
## 2020-04-25 NOTE — Telephone Encounter (Signed)
Pt sick, Virtual visit.

## 2020-04-26 ENCOUNTER — Encounter: Payer: Self-pay | Admitting: Registered Nurse

## 2020-05-02 ENCOUNTER — Encounter: Payer: Self-pay | Admitting: Registered Nurse

## 2020-05-02 ENCOUNTER — Other Ambulatory Visit: Payer: Self-pay

## 2020-05-02 ENCOUNTER — Ambulatory Visit: Payer: Self-pay | Admitting: Registered Nurse

## 2020-05-02 ENCOUNTER — Ambulatory Visit (INDEPENDENT_AMBULATORY_CARE_PROVIDER_SITE_OTHER): Payer: 59 | Admitting: Registered Nurse

## 2020-05-02 DIAGNOSIS — U071 COVID-19: Secondary | ICD-10-CM

## 2020-05-02 NOTE — Patient Instructions (Signed)
° ° ° °  If you have lab work done today you will be contacted with your lab results within the next 2 weeks.  If you have not heard from us then please contact us. The fastest way to get your results is to register for My Chart. ° ° °IF you received an x-ray today, you will receive an invoice from [facility]. Please contact [facility] at [phone] with questions or concerns regarding your invoice.  ° °IF you received labwork today, you will receive an invoice from [facility]. Please contact [facility] at [phone] with questions or concerns regarding your invoice.  ° °Our billing staff will not be able to assist you with questions regarding bills from these companies. ° °You will be contacted with the lab results as soon as they are available. The fastest way to get your results is to activate your My Chart account. Instructions are located on the last page of this paperwork. If you have not heard from us regarding the results in 2 weeks, please contact this office. °  ° ° ° °

## 2020-05-04 ENCOUNTER — Encounter: Payer: Self-pay | Admitting: Emergency Medicine

## 2020-05-04 ENCOUNTER — Telehealth: Payer: Self-pay | Admitting: Registered Nurse

## 2020-05-04 NOTE — Telephone Encounter (Signed)
Work note has been updated and left a message on patients machine that the letter is at the front desk

## 2020-05-04 NOTE — Telephone Encounter (Signed)
Pt is needing the RTW letter that she got on 05/02/20 to be modified. In the 2nd paragraph-She is fit to resume working on a limited basis with -the phrase "on a limited basis" and "full duty" taken out. She needs this by the end of today. Please advise at [phone]. She would like to pick up when ready.
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
## 2020-05-09 ENCOUNTER — Ambulatory Visit (INDEPENDENT_AMBULATORY_CARE_PROVIDER_SITE_OTHER): Payer: 59 | Admitting: Family Medicine

## 2020-05-09 ENCOUNTER — Other Ambulatory Visit: Payer: Self-pay

## 2020-05-09 ENCOUNTER — Ambulatory Visit: Payer: 59 | Attending: Registered Nurse | Admitting: Physical Therapy

## 2020-05-09 ENCOUNTER — Encounter: Payer: Self-pay | Admitting: Physical Therapy

## 2020-05-09 VITALS — BP 108/74 | HR 82 | Ht 65.0 in | Wt 231.2 lb

## 2020-05-09 DIAGNOSIS — M6281 Muscle weakness (generalized): Secondary | ICD-10-CM | POA: Insufficient documentation

## 2020-05-09 DIAGNOSIS — M545 Low back pain, unspecified: Secondary | ICD-10-CM | POA: Diagnosis present

## 2020-05-09 DIAGNOSIS — S060X0A Concussion without loss of consciousness, initial encounter: Secondary | ICD-10-CM | POA: Diagnosis not present

## 2020-05-09 DIAGNOSIS — R2681 Unsteadiness on feet: Secondary | ICD-10-CM | POA: Insufficient documentation

## 2020-05-09 DIAGNOSIS — F411 Generalized anxiety disorder: Secondary | ICD-10-CM | POA: Diagnosis not present

## 2020-05-09 DIAGNOSIS — M542 Cervicalgia: Secondary | ICD-10-CM | POA: Insufficient documentation

## 2020-05-09 NOTE — Patient Instructions (Signed)
Access Code: [facility] URL: [email or web address] Date: 05/09/2020 Prepared by: Rosana Hoes  Exercises  Seated Assisted Cervical Rotation with Towel - 2 x daily - 7 x weekly - 10 reps - 5 seconds hold Supine Chin Tuck - 2 x daily - 7 x weekly - 10 reps - 10 seconds hold Supine March with Posterior Pelvic Tilt - 2 x daily - 7 x weekly - 2 sets - 10 reps Tandem Stance with Eyes Closed - 2 x daily - 7 x weekly - 3 reps - 30 seconds hold

## 2020-05-09 NOTE — Addendum Note (Signed)
Addended by: Hilbert Bible on: 05/09/2020 05:15 PM   Modules accepted: Orders
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
## 2020-05-09 NOTE — Patient Instructions (Addendum)
Thank you for coming in today.  I will speak with Breanna Valenzuela about work.  I think a return to work at 8 hours per day starting on Monday Feb 7th for 4 weeks then a a return to work with no restrictions is reasonable.   Recheck with me in 1 month if not able to return to work fully in about 1 month.

## 2020-05-10 ENCOUNTER — Telehealth: Payer: Self-pay | Admitting: Registered Nurse

## 2020-05-10 NOTE — Telephone Encounter (Signed)
Patient also request note for job to be uploaded to her my chart also. Please advise

## 2020-05-10 NOTE — Telephone Encounter (Signed)
Pt needs letter listing any limitations she has and sent to MyChart. If you can tell me the limitations I can compose the letter

## 2020-05-10 NOTE — Telephone Encounter (Signed)
Patient stated the sick note she received gives restrictions. Patient's job won't allow her to return until she provides a new note lifting all restrictions.  Original note released patient to go back to work 05/09/2020. Patient's job will work with her concerning hours she needs to work. Please fax to patient's job at [phone], attention [facility]. Patient requesting new note be faxed asap.  Please advise patient at [phone].

## 2020-05-14 ENCOUNTER — Ambulatory Visit: Payer: 59

## 2020-05-14 ENCOUNTER — Encounter: Payer: Self-pay | Admitting: Registered Nurse

## 2020-05-15 ENCOUNTER — Other Ambulatory Visit: Payer: Self-pay

## 2020-05-15 ENCOUNTER — Ambulatory Visit: Payer: 59

## 2020-05-15 ENCOUNTER — Encounter: Payer: Self-pay | Admitting: Registered Nurse

## 2020-05-15 DIAGNOSIS — M6281 Muscle weakness (generalized): Secondary | ICD-10-CM

## 2020-05-15 DIAGNOSIS — M542 Cervicalgia: Secondary | ICD-10-CM

## 2020-05-15 DIAGNOSIS — M545 Low back pain, unspecified: Secondary | ICD-10-CM

## 2020-05-15 DIAGNOSIS — R2681 Unsteadiness on feet: Secondary | ICD-10-CM

## 2020-05-15 NOTE — Therapy (Addendum)
[place], [place], [address] Phone: [phone]   Fax:  [phone]  Physical Therapy Treatment / Discharge  Patient Details  Name: Breanna Valenzuela MRN: [id number] Date of Birth: 1980-07-20 Referring Provider (PT): Maximiano Coss, NP   Encounter Date: 05/15/2020   PT End of Session - 05/15/20 1130    Visit Number 2    Number of Visits 10    Date for PT Re-Evaluation 06/20/20    Authorization Type [employer] 2022    PT Start Time 1054   pt arrived late then was in restroom   PT Stop Time 1128    PT Time Calculation (min) 34 min    Activity Tolerance Patient tolerated treatment well    Behavior During Therapy [facility] for tasks assessed/performed           Past Medical History:  Diagnosis Date  . Allergy   . Pap smear abnormality of cervix     History reviewed. No pertinent surgical history.  There were no vitals filed for this visit.   Subjective Assessment - 05/15/20 1057    Subjective "They got me on 16 hour shifts for 2 days/week. I start back tomorrow but this week will only be one shift. I want to switch over to 8 hour shifts. I am feeling pretty good today." Pt reports having pain with activity but not above 5/10. She states she does not have pain when just relaxing.    Pertinent History recent Covid    Limitations Lifting    How long can you sit comfortably? no problem    How long can you stand comfortably? no problem    How long can you walk comfortably? no problem    Diagnostic tests x-rays    Patient Stated Goals get better balance and figure out how to put less stress on back    Currently in Pain? No/denies    Pain Score 0-No pain    Pain Onset More than a month ago    Pain Onset More than a month ago              [facility] PT Assessment - 05/15/20 0001      Assessment   Medical Diagnosis Concussion without loss of consciousness    Referring Provider (PT) Maximiano Coss, NP     Onset Date/Surgical Date 03/19/20                         [facility] Adult PT Treatment/Exercise - 05/15/20 0001      Neck Exercises: Supine   Neck Retraction 10 reps    Neck Retraction Limitations Reviewed proper form and provided tactile, visual, and verbal cues      Lumbar Exercises: Stretches   Lower Trunk Rotation Other (comment)   10x each direction   Lower Trunk Rotation Limitations 2-3 sec hold each direction      Lumbar Exercises: Standing   Lifting Limitations Lifting 15# box then turning to place back down - cues for mechanics and to avoid lifting and twisting    Row Strengthening;Both;20 reps;Theraband    Theraband Level (Row) Level 4 (Blue)    Other Standing Lumbar Exercises Tandem standing EO x 30 sec each then EC x 30 sec each    Other Standing Lumbar Exercises Pallof press with 2 blue therabands and cues for core activation x 10 each direction      Lumbar Exercises: Supine   Dead Bug 5  reps   2 x 5 each UE/LE   Dead Bug Limitations LE positioned at 90/90 with LE EXT and opposite UE FL                  PT Education - 05/15/20 1936    Education Details Reviewed HEP, rationale and technique for interventions, body mechanics when lifting and turning 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    Time 3    Period Weeks    Status New    Target Date 05/30/20             PT Long Term Goals - 05/09/20 1540      PT LONG TERM GOAL #1   Title Pt will demonstrate proper lifting mechanics for her job in order to protect her  back and neck    Time 6    Period Weeks    Status New    Target Date 06/20/20      PT LONG TERM GOAL #2   Title Establish balance goal when assessed    Time 6    Period Weeks    Status New    Target Date 06/20/20      PT LONG TERM GOAL #3   Title Pt will have 2/10 or less pain at night in her neck and back in order to be able to sleep restfully at night.    Time 6    Period Weeks    Status New    Target Date 06/20/20      PT LONG TERM GOAL #4   Title Pt will be Independent in a final HEP to maintain or progress achieved PLOF    Time 6    Period Weeks    Status New    Target Date 06/20/20      PT LONG TERM GOAL #5   Title Pt will go from a 78% to an 85% on FOTO in order to show functional improvement    Time 6    Period Weeks    Status New    Target Date 06/20/20                 Plan - 05/15/20 1100    Clinical Impression Statement Patient tolerated session well with no adverse effects or complaints of neck, low back, or wrist pain during interventions. She experienced fatigue in L ankle with LE exercises, especially during tandem standing and forward step ups on 6" step. She demonstrated improper form while performing cervical retraction with complaints that she had increased discomfort in neck while performing them at home - reviewed technique with positive response from pt after 10 reps. She should continue to benefit from skilled PT to address deficits and continue strengthening and focus on body mechanics to avoid increased pain or further injury as pt returns to work as Quarry manager.    Personal Factors and Comorbidities Profession    Examination-Activity Limitations  Bend;Lift;Other;Carry;Sleep   laying on stomach   Examination-Participation Restrictions Occupation    Stability/Clinical Decision Making Stable/Uncomplicated    Rehab Potential Good    PT Frequency 2x / week    PT Duration 6 weeks   2x/week for 2 weeks, then 1x/week for rest of POC   PT  Treatment/Interventions Moist Heat;ADLs/Self Care Home Management;Therapeutic activities;Therapeutic exercise;Neuromuscular re-education;Patient/family education;Manual techniques;Passive range of motion;Vestibular;Functional mobility training;Spinal Manipulations    PT Next Visit Plan Review and update HEP pending response to previous session. Balance activities (greater difficulty with LLE), core/trunk strengthening, BLE strengthening, body mechanics and posture, lifting    PT Home Exercise Plan [email or web address]    Consulted and Agree with Plan of Care Patient           Patient will benefit from skilled therapeutic intervention in order to improve the following deficits and impairments:  Pain,Hypomobility,Decreased mobility,Decreased strength,Decreased balance,Decreased endurance,Dizziness  Visit Diagnosis: Muscle weakness (generalized)  Acute bilateral low back pain without sciatica  Cervicalgia  Unsteadiness on feet     Problem List Patient Active Problem List   Diagnosis Date Noted  . Depression 04/25/2020  . PTSD (post-traumatic stress disorder) 04/25/2020      Haydee Monica, PT, DPT 05/15/20 7:45 PM  [facility] [address] [place], [place], [address] Phone: [phone]   Fax:  [phone]  Name: Breanna Valenzuela MRN: [id number] Date of Birth: 1980-07-20    PHYSICAL THERAPY DISCHARGE SUMMARY  Visits from Start of Care: 2  Current functional level related to goals / functional outcomes: See above   Remaining deficits: See above   Education / Equipment: HEP Plan:                                                    Patient goals were not met. Patient is being discharged due to not returning since the last visit.  ?????    Hilda Blades, PT, DPT, LAT, ATC 06/21/20  12:46 PM Phone: [phone] Fax: [phone]

## 2020-05-16 ENCOUNTER — Encounter: Payer: 59 | Admitting: Physical Therapy

## 2020-05-23 ENCOUNTER — Ambulatory Visit: Payer: 59 | Admitting: Rehabilitative and Restorative Service Providers"

## 2020-05-28 ENCOUNTER — Ambulatory Visit: Payer: 59 | Admitting: Physical Therapy

## 2020-05-29 ENCOUNTER — Telehealth: Payer: Self-pay | Admitting: Physical Therapy

## 2020-05-29 NOTE — Telephone Encounter (Signed)
Left VM for patient due to missed PT appointment on 05/28/20. Patient reminded of next scheduled appointment on 06/04/20 and informed of attendance policy. Patient encouraged to contact PT clinic if she needs to reschedule/cancel future appointments.  Rosana Hoes, PT, DPT, LAT, ATC 05/29/20  11:46 AM Phone: [phone] Fax: [phone]

## 2020-06-04 ENCOUNTER — Ambulatory Visit: Payer: 59 | Admitting: Physical Therapy

## 2020-06-05 ENCOUNTER — Telehealth: Payer: Self-pay | Admitting: Physical Therapy

## 2020-06-05 NOTE — Telephone Encounter (Signed)
Attempted to contact patient due to missed PT appointment. Left VM informing patient of her 2nd consecutive no-show and that this was her last scheduled appointment. Patient encouraged to contact PT office to schedule future appointment as needed, but per attendance policy she would only be allowed to schedule 1 visit at a time.  Rosana Hoes, PT, DPT, LAT, ATC 06/05/20  3:36 PM Phone: [phone] Fax: [phone]

## 2020-06-13 ENCOUNTER — Other Ambulatory Visit: Payer: Self-pay

## 2020-06-13 ENCOUNTER — Ambulatory Visit (INDEPENDENT_AMBULATORY_CARE_PROVIDER_SITE_OTHER): Payer: 59 | Admitting: Registered Nurse

## 2020-06-13 DIAGNOSIS — Z111 Encounter for screening for respiratory tuberculosis: Secondary | ICD-10-CM

## 2020-06-13 NOTE — Progress Notes (Signed)
Tuberculin skin test applied to right ventral forearm.

## 2020-06-13 NOTE — Patient Instructions (Signed)

## 2020-06-15 ENCOUNTER — Other Ambulatory Visit: Payer: Self-pay

## 2020-06-15 ENCOUNTER — Encounter: Payer: Self-pay | Admitting: Registered Nurse

## 2020-06-15 ENCOUNTER — Ambulatory Visit (INDEPENDENT_AMBULATORY_CARE_PROVIDER_SITE_OTHER): Payer: 59 | Admitting: Registered Nurse

## 2020-06-15 VITALS — BP 103/73 | HR 83 | Temp 98.0°F | Resp 18 | Ht 65.0 in | Wt 213.4 lb

## 2020-06-15 DIAGNOSIS — Z0000 Encounter for general adult medical examination without abnormal findings: Secondary | ICD-10-CM

## 2020-06-15 DIAGNOSIS — Z23 Encounter for immunization: Secondary | ICD-10-CM

## 2020-06-15 DIAGNOSIS — Z021 Encounter for pre-employment examination: Secondary | ICD-10-CM

## 2020-06-15 MED ORDER — PNEUMOCOCCAL 13-VAL CONJ VACC IM SUSP
0.5000 mL | INTRAMUSCULAR | 0 refills | Status: AC
Start: 2020-06-15 — End: 2020-06-15

## 2020-06-15 NOTE — Patient Instructions (Signed)
° ° ° °  If you have lab work done today you will be contacted with your lab results within the next 2 weeks.  If you have not heard from us then please contact us. The fastest way to get your results is to register for My Chart. ° ° °IF you received an x-ray today, you will receive an invoice from [facility]. Please contact  Radiology at [phone] with questions or concerns regarding your invoice.  ° °IF you received labwork today, you will receive an invoice from [facility]. Please contact [facility] at [phone] with questions or concerns regarding your invoice.  ° °Our billing staff will not be able to assist you with questions regarding bills from these companies. ° °You will be contacted with the lab results as soon as they are available. The fastest way to get your results is to activate your My Chart account. Instructions are located on the last page of this paperwork. If you have not heard from us regarding the results in 2 weeks, please contact this office. °  ° ° ° °

## 2020-06-15 NOTE — Progress Notes (Signed)
Established Patient Office Visit  Subjective:  Patient ID: Breanna Valenzuela, female    DOB: 1980/05/08  Age: 40 y.o. MRN: [id number]  CC:  Chief Complaint  Patient presents with  . PPD Reading    Patient states she is here for PPB reading and a letter for physical for work.    HPI Denita Lun presents for work clearance  Starting with [employer] No history suggesting she would not be fit - needs paperwork signed, PPD read (placed Wednesday). Needs pneumonia vaccine as well - has not had, would be interested.   Otherwise no new concerns  Will return for second PPD placement at soonest 7 days from now but no later than 21 days.  Past Medical History:  Diagnosis Date  . Allergy   . Pap smear abnormality of cervix     No past surgical history on file.  Family History  Problem Relation Age of Onset  . Diabetes Mother   . Hyperlipidemia Mother   . Mental illness Brother   . Hyperlipidemia Brother   . Mental illness Brother     Social History   Socioeconomic History  . Marital status: Single    Spouse name: Not on file  . Number of children: Not on file  . Years of education: Not on file  . Highest education level: Not on file  Occupational History  . Not on file  Tobacco Use  . Smoking status: Former Games developer  . Smokeless tobacco: Never Used  Substance and Sexual Activity  . Alcohol use: Yes    Alcohol/week: 0.0 standard drinks    Comment: occ  . Drug use: No  . Sexual activity: Not on file  Other Topics Concern  . Not on file  Social History Narrative  . Not on file   Social Determinants of Health   Financial Resource Strain: Not on file  Food Insecurity: Not on file  Transportation Needs: Not on file  Physical Activity: Not on file  Stress: Not on file  Social Connections: Not on file  Intimate Partner Violence: Not on file    Outpatient Medications Prior to Visit  Medication Sig Dispense Refill  . diphenhydrAMINE (BENADRYL) 25 mg capsule  Take 25 mg by mouth every 6 (six) hours as needed for allergies.    Marland Kitchen nortriptyline (PAMELOR) 25 MG capsule Take 1 capsule (25 mg total) by mouth at bedtime. 30 capsule 2  . nystatin (MYCOSTATIN) 100000 UNIT/ML suspension Take 5 mLs (500,000 Units total) by mouth 4 (four) times daily. 473 mL 0   No facility-administered medications prior to visit.    Allergies  Allergen Reactions  . Oxycodone Nausea And Vomiting and Other (See Comments)    Patient gets delirious, says its too strong   . Tomato Itching, Swelling and Rash  . Pineapple Itching, Swelling and Rash    ROS Review of Systems  Constitutional: Negative.   HENT: Negative.   Eyes: Negative.   Respiratory: Negative.   Cardiovascular: Negative.   Gastrointestinal: Negative.   Genitourinary: Negative.   Musculoskeletal: Negative.   Skin: Negative.   Neurological: Negative.   Psychiatric/Behavioral: Negative.   All other systems reviewed and are negative.     Objective:    Physical Exam Vitals and nursing note reviewed.  Constitutional:      General: She is not in acute distress.    Appearance: Normal appearance. She is normal weight. She is not ill-appearing, toxic-appearing or diaphoretic.  Cardiovascular:     Rate and  Rhythm: Normal rate and regular rhythm.     Heart sounds: Normal heart sounds. No murmur heard. No friction rub. No gallop.   Pulmonary:     Effort: Pulmonary effort is normal. No respiratory distress.     Breath sounds: Normal breath sounds. No stridor. No wheezing, rhonchi or rales.  Chest:     Chest wall: No tenderness.  Musculoskeletal:        General: No swelling, tenderness, deformity or signs of injury. Normal range of motion.     Right lower leg: No edema.     Left lower leg: No edema.  Skin:    General: Skin is warm and dry.  Neurological:     General: No focal deficit present.     Mental Status: She is alert and oriented to person, place, and time. Mental status is at baseline.   Psychiatric:        Mood and Affect: Mood normal.        Behavior: Behavior normal.        Thought Content: Thought content normal.        Judgment: Judgment normal.     BP 103/73   Pulse 83   Temp 98 F (36.7 C) (Temporal)   Resp 18   Ht 5\' 5"  (1.651 m)   Wt 213 lb 6.4 oz (96.8 kg)   SpO2 98%   BMI 35.51 kg/m  Wt Readings from Last 3 Encounters:  06/15/20 213 lb 6.4 oz (96.8 kg)  05/09/20 231 lb 3.2 oz (104.9 kg)  05/02/20 216 lb (98 kg)     There are no preventive care reminders to display for this patient.  There are no preventive care reminders to display for this patient.  Lab Results  Component Value Date   TSH 1.710 07/25/2019   Lab Results  Component Value Date   WBC 4.0 07/25/2019   HGB 14.3 07/25/2019   HCT 44.1 07/25/2019   MCV 84 07/25/2019   PLT 276 07/25/2019   Lab Results  Component Value Date   NA 139 07/25/2019   K 3.9 07/25/2019   CO2 24 07/25/2019   GLUCOSE 83 07/25/2019   BUN 10 07/25/2019   CREATININE 0.73 07/25/2019   BILITOT 0.3 07/25/2019   ALKPHOS 76 07/25/2019   AST 16 07/25/2019   ALT 18 07/25/2019   PROT 7.8 07/25/2019   ALBUMIN 4.3 07/25/2019   CALCIUM 9.5 07/25/2019   Lab Results  Component Value Date   CHOL 217 (H) 07/25/2019   Lab Results  Component Value Date   HDL 42 07/25/2019   Lab Results  Component Value Date   LDLCALC 152 (H) 07/25/2019   Lab Results  Component Value Date   TRIG 128 07/25/2019   Lab Results  Component Value Date   CHOLHDL 5.2 (H) 07/25/2019   Lab Results  Component Value Date   HGBA1C 5.9 (H) 07/25/2019      Assessment & Plan:   Problem List Items Addressed This Visit   None   Visit Diagnoses    Physical exam, pre-employment    -  Primary   Need for prophylactic vaccination against Streptococcus pneumoniae (pneumococcus)       Relevant Medications   pneumococcal 13-valent conjugate vaccine (PREVNAR 13) SUSP injection      Meds ordered this encounter  Medications   . pneumococcal 13-valent conjugate vaccine (PREVNAR 13) SUSP injection    Sig: Inject 0.5 mLs into the muscle tomorrow at 10 am for 1 dose.  Dispense:  0.5 mL    Refill:  0    Order Specific Question:   Supervising Provider    Answer:   Neva Seat, JEFFREY R [[id number]]    Follow-up: No follow-ups on file.   PLAN  PPD negative  Paperwork filled out - no concerns  ppsv 15 sent to pharmacy  Patient encouraged to call clinic with any questions, comments, or concerns.  Janeece Agee, NP

## 2020-11-26 ENCOUNTER — Ambulatory Visit (HOSPITAL_COMMUNITY)
Admission: EM | Admit: 2020-11-26 | Discharge: 2020-11-26 | Disposition: A | Discharge status: Home / Self Care | Payer: 59 | Attending: Emergency Medicine | Admitting: Emergency Medicine

## 2020-11-26 ENCOUNTER — Encounter (HOSPITAL_COMMUNITY): Payer: Self-pay | Admitting: Emergency Medicine

## 2020-11-26 ENCOUNTER — Other Ambulatory Visit: Payer: Self-pay

## 2020-11-26 DIAGNOSIS — J020 Streptococcal pharyngitis: Secondary | ICD-10-CM | POA: Diagnosis not present

## 2020-11-26 LAB — POCT RAPID STREP A, ED / UC: Streptococcus, Group A Screen (Direct): NEGATIVE

## 2020-11-26 MED ORDER — AMOXICILLIN 500 MG PO CAPS: 500.0000 mg | ORAL_CAPSULE | Freq: Two times a day (BID) | ORAL | 0 refills | Status: AC

## 2020-11-26 MED ORDER — ACETAMINOPHEN 325 MG PO TABS
ORAL_TABLET | ORAL | Status: AC
  Filled 2020-11-26: qty 2

## 2020-11-26 MED ORDER — LIDOCAINE VISCOUS HCL 2 % MT SOLN: 15.0000 mL | OROMUCOSAL | 0 refills | Status: DC | PRN

## 2020-11-26 MED ORDER — ACETAMINOPHEN 325 MG PO TABS
650.0000 mg | ORAL_TABLET | Freq: Once | ORAL | Status: AC
  Administered 2020-11-26: 650 mg via ORAL

## 2020-11-26 NOTE — ED Provider Notes (Signed)
[facility]    CSN: [id number] Arrival date & time: 11/26/20  0805      History   Chief Complaint Chief Complaint  Patient presents with   Sore Throat    HPI Breanna Valenzuela is a 40 y.o. female presenting with sore throat and fevers x3 days. Medical history allergies.  Has not monitored her temperature at home, but endorses subjective chills.  Denies trouble swallowing, sensation of throat closing. Denies n/v/d, shortness of breath, chest pain, cough, congestion, facial pain, teeth pain, headaches, loss of taste/smell, swollen lymph nodes, ear pain.    HPI  Past Medical History:  Diagnosis Date   Allergy    Pap smear abnormality of cervix     Patient Active Problem List   Diagnosis Date Noted   Depression 04/25/2020   PTSD (post-traumatic stress disorder) 04/25/2020    History reviewed. No pertinent surgical history.  OB History   No obstetric history on file.      Home Medications    Prior to Admission medications   Medication Sig Start Date End Date Taking? Authorizing Provider  amoxicillin (AMOXIL) 500 MG capsule Take 1 capsule (500 mg total) by mouth 2 (two) times daily for 10 days. 11/26/20 12/06/20 Yes Rhys Martini, PA-C  lidocaine (XYLOCAINE) 2 % solution Use as directed 15 mLs in the mouth or throat as needed for mouth pain. 11/26/20  Yes Rhys Martini, PA-C  diphenhydrAMINE (BENADRYL) 25 mg capsule Take 25 mg by mouth every 6 (six) hours as needed for allergies.    [provider]  nortriptyline (PAMELOR) 25 MG capsule Take 1 capsule (25 mg total) by mouth at bedtime. 04/25/20   Rodolph Bong, MD  nystatin (MYCOSTATIN) 100000 UNIT/ML suspension Take 5 mLs (500,000 Units total) by mouth 4 (four) times daily. Patient not taking: Reported on 11/26/2020 03/25/20   Junie Spencer, FNP    Family History Family History  Problem Relation Age of Onset   Diabetes Mother    Hyperlipidemia Mother    Mental illness Brother    Hyperlipidemia  Brother    Mental illness Brother     Social History Social History   Tobacco Use   Smoking status: Former   Smokeless tobacco: Never  Building services engineer Use: Never used  Substance Use Topics   Alcohol use: Yes    Alcohol/week: 0.0 standard drinks    Comment: occ   Drug use: No     Allergies   Oxycodone, Tomato, and Pineapple   Review of Systems Review of Systems  Constitutional:  Negative for appetite change, chills and fever.  HENT:  Positive for sore throat. Negative for congestion, ear pain, rhinorrhea, sinus pressure and sinus pain.   Eyes:  Negative for redness and visual disturbance.  Respiratory:  Negative for cough, chest tightness, shortness of breath and wheezing.   Cardiovascular:  Negative for chest pain and palpitations.  Gastrointestinal:  Negative for abdominal pain, constipation, diarrhea, nausea and vomiting.  Genitourinary:  Negative for dysuria, frequency and urgency.  Musculoskeletal:  Negative for myalgias.  Neurological:  Negative for dizziness, weakness and headaches.  Psychiatric/Behavioral:  Negative for confusion.   All other systems reviewed and are negative.   Physical Exam Triage Vital Signs ED Triage Vitals  Enc Vitals Group     BP 11/26/20 0822 120/79     Pulse Rate 11/26/20 0822 (!) 101     Resp 11/26/20 0822 20     Temp 11/26/20 5093 Marland Kitchen)  101 F (38.3 C)     Temp Source 11/26/20 0822 Oral     SpO2 11/26/20 0822 99 %     Weight --      Height --      Head Circumference --      Peak Flow --      Pain Score 11/26/20 0817 10     Pain Loc --      Pain Edu? --      Excl. in GC? --    No data found.  Updated Vital Signs BP 120/79 (BP Location: Right Arm) Comment (BP Location): large cuff  Pulse (!) 101   Temp (!) 101 F (38.3 C) (Oral)   Resp 20   SpO2 99%   Visual Acuity Right Eye Distance:   Left Eye Distance:   Bilateral Distance:    Right Eye Near:   Left Eye Near:    Bilateral Near:     Physical Exam Vitals  reviewed.  Constitutional:      General: She is not in acute distress.    Appearance: Normal appearance. She is not ill-appearing.  HENT:     Head: Normocephalic and atraumatic.     Right Ear: Tympanic membrane, ear canal and external ear normal. No tenderness. No middle ear effusion. There is no impacted cerumen. Tympanic membrane is not perforated, erythematous, retracted or bulging.     Left Ear: Tympanic membrane, ear canal and external ear normal. No tenderness.  No middle ear effusion. There is no impacted cerumen. Tympanic membrane is not perforated, erythematous, retracted or bulging.     Nose: Nose normal. No congestion.     Mouth/Throat:     Mouth: Mucous membranes are moist.     Pharynx: Uvula midline. Posterior oropharyngeal erythema present. No oropharyngeal exudate.     Tonsils: Tonsillar exudate present. 2+ on the right. 2+ on the left.     Comments: Tonsils are 2+ bilaterally with exudate. On exam, uvula is midline, she is tolerating her secretions without difficulty, there is no trismus, no drooling, she has normal phonation  Eyes:     Extraocular Movements: Extraocular movements intact.     Pupils: Pupils are equal, round, and reactive to light.  Cardiovascular:     Rate and Rhythm: Normal rate and regular rhythm.     Heart sounds: Normal heart sounds.  Pulmonary:     Effort: Pulmonary effort is normal.     Breath sounds: Normal breath sounds. No decreased breath sounds, wheezing, rhonchi or rales.  Abdominal:     Palpations: Abdomen is soft.     Tenderness: There is no abdominal tenderness. There is no guarding or rebound.  Lymphadenopathy:     Cervical: Cervical adenopathy present.     Right cervical: Superficial cervical adenopathy present.     Left cervical: Superficial cervical adenopathy present.  Neurological:     General: No focal deficit present.     Mental Status: She is alert and oriented to person, place, and time.  Psychiatric:        Mood and Affect:  Mood normal.        Behavior: Behavior normal.        Thought Content: Thought content normal.        Judgment: Judgment normal.     UC Treatments / Results  Labs (all labs ordered are listed, but only abnormal results are displayed) Labs Reviewed  CULTURE, GROUP A STREP [facility])  POCT RAPID STREP A, ED / UC  EKG   Radiology No results found.  Procedures Procedures (including critical care time)  Medications Ordered in UC Medications  acetaminophen (TYLENOL) tablet 650 mg (650 mg Oral Given 11/26/20 1062)    Initial Impression / Assessment and Plan / UC Course  I have reviewed the triage vital signs and the nursing notes.  Pertinent labs & imaging results that were available during my care of the patient were reviewed by me and considered in my medical decision making (see chart for details).     This patient is a very pleasant 40 y.o. year old female presenting with strep pharyngitis. Febrile, tachycardic.   Centor score 4. Rapid strep negative, culture sent.   Following discussion of risks and benefits patient wishes to proceed with treatment for presumed strep pharyngitis with amoxicillin and viscous lidocaine.  States she is not pregnant or breastfeeding.  ED return precautions discussed. Patient verbalizes understanding and agreement.   Coding Level 4 for acute illness with systemic symptoms, and prescription drug management  Final Clinical Impressions(s) / UC Diagnoses   Final diagnoses:  Strep pharyngitis     Discharge Instructions      -Start the antibiotic-Amoxicillin, 1 pill every 12 hours for 10 days.  You can take this with food like with breakfast and dinner. -For sore throat, use lidocaine mouthwash up to every 4 hours. Make sure not to eat for at least 1 hour after using this, as your mouth will be very numb and you could bite yourself. -You can continue tylenol/ibuprofen for discomfort, and make sure to drink plenty of fluids -You'll still  be contagious for 24 hours after starting the antibiotic. This means you can go back to work in 1 day.  -Make sure to throw out your toothbrush after 24 hours so you don't give the strep back to yourself.  -Seek additional medical attention if symptoms are getting worse instead of better- trouble swallowing, shortness of breath, voice changes, etc.      ED Prescriptions     Medication Sig Dispense Auth. Provider   amoxicillin (AMOXIL) 500 MG capsule Take 1 capsule (500 mg total) by mouth 2 (two) times daily for 10 days. 20 capsule Ignacia Bayley E, PA-C   lidocaine (XYLOCAINE) 2 % solution Use as directed 15 mLs in the mouth or throat as needed for mouth pain. 100 mL Rhys Martini, PA-C      PDMP not reviewed this encounter.   Rhys Martini, PA-C 11/26/20 [phone]

## 2020-11-26 NOTE — Discharge Instructions (Addendum)
-  Start the antibiotic-Amoxicillin, 1 pill every 12 hours for 10 days.  You can take this with food like with breakfast and dinner. °-For sore throat, use lidocaine mouthwash up to every 4 hours. Make sure not to eat for at least 1 hour after using this, as your mouth will be very numb and you could bite yourself. °-You can continue tylenol/ibuprofen for discomfort, and make sure to drink plenty of fluids °-You'll still be contagious for 24 hours after starting the antibiotic. This means you can go back to work in 1 day.  °-Make sure to throw out your toothbrush after 24 hours so you don't give the strep back to yourself.  °-Seek additional medical attention if symptoms are getting worse instead of better- trouble swallowing, shortness of breath, voice changes, etc. ° °

## 2020-11-26 NOTE — ED Triage Notes (Signed)
Patient has a sore throat for 3 days.  Patient complains of chills, feverish yesterday.  Taking ibuprofen and tylenol.

## 2020-11-28 ENCOUNTER — Encounter: Payer: Self-pay | Admitting: Registered Nurse

## 2020-11-28 ENCOUNTER — Telehealth: Payer: 59 | Admitting: Nurse Practitioner

## 2020-11-28 DIAGNOSIS — J029 Acute pharyngitis, unspecified: Secondary | ICD-10-CM

## 2020-11-28 LAB — CULTURE, GROUP A STREP (THRC)

## 2020-11-28 NOTE — Progress Notes (Signed)
Based on what you shared with me it looks like you have concern for possible flu or pneumonia,that should be evaluated in a face to face office visit. You will need a face to face visit to be tested for either of these. I will put a  note in your my chart to return to work timorrow night to give you time to get this checked.  NOTE: There will be NO CHARGE for this eVisit   If you are having a true medical emergency please call 911.      For an urgent face to face visit, [facility] has six urgent care centers for your convenience:     [facility] Health Urgent [facility] Center at [facility] Directions [phone] [address] [place], [place] [address]    [facility] Health Urgent Care Center [facility]) Get Driving Directions [phone] [address] [place], [place] [address]  [facility] Health Urgent Care Center [facility]) Get Driving Directions [phone] [address] [place],  [place]  [address]  [facility] Health Urgent Care at [facility] Get Driving Directions [phone] [address] [place], [place] [address]   [facility] Health Urgent Care at [facility] Get Driving Directions  [phone] [address] [place], [place] [address]   [facility] Health Urgent Care at [facility] Directions [phone] [address], [address] [place], [place] [address]  Your MyChart E-visit questionnaire answers were reviewed by a board certified advanced clinical practitioner to complete your personal care plan based on your specific symptoms.  Thank you for using e-Visits.

## 2020-12-01 ENCOUNTER — Encounter: Payer: Self-pay | Admitting: Family Medicine

## 2020-12-01 NOTE — Telephone Encounter (Signed)
Patient to review symptoms and plan.  She is on treatment for her throat, sore throat has improved but still sore, able to drink fluids.  Still sees some possible exudate at the back of her throat.  No fevers, no shortness of breath.  Again some improvement but still fatigue, diffuse body aches and backache.  Has been taking leftover muscle relaxant.  Plans to be seen in urgent care today or tomorrow if that is not improving. Based on history and discussion on phone I will provide a note from 8/24 through tomorrow, but advised to be seen if not improving or sooner if worse.

## 2021-01-25 ENCOUNTER — Ambulatory Visit (INDEPENDENT_AMBULATORY_CARE_PROVIDER_SITE_OTHER): Payer: 59 | Admitting: Family Medicine

## 2021-01-25 ENCOUNTER — Encounter: Payer: Self-pay | Admitting: Family Medicine

## 2021-01-25 ENCOUNTER — Other Ambulatory Visit: Payer: Self-pay

## 2021-01-25 ENCOUNTER — Ambulatory Visit (INDEPENDENT_AMBULATORY_CARE_PROVIDER_SITE_OTHER): Payer: 59

## 2021-01-25 VITALS — BP 110/78 | HR 72 | Ht 65.0 in | Wt 217.2 lb

## 2021-01-25 DIAGNOSIS — M545 Low back pain, unspecified: Secondary | ICD-10-CM

## 2021-01-25 DIAGNOSIS — G8929 Other chronic pain: Secondary | ICD-10-CM

## 2021-01-25 DIAGNOSIS — M542 Cervicalgia: Secondary | ICD-10-CM | POA: Insufficient documentation

## 2021-01-25 DIAGNOSIS — G44329 Chronic post-traumatic headache, not intractable: Secondary | ICD-10-CM

## 2021-01-25 MED ORDER — TIZANIDINE HCL 4 MG PO TABS: 4.0000 mg | ORAL_TABLET | Freq: Three times a day (TID) | ORAL | 1 refills | Status: DC | PRN
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
## 2021-01-25 NOTE — Patient Instructions (Addendum)
Good to see you today.  Please get an Xray today before you leave.  I've referred you to Physical Therapy.  Please let us know if you haven't heard from them in one week regarding scheduling.  I've prescribed you Tizanidine.  Follow-up: phone visit in 6 weeks

## 2021-01-27 NOTE — Telephone Encounter (Signed)
This concern has been previously addressed by myself and/or another provider.  If they patient has ongoing concerns, they can contact me at their convenience.  Thank you,  Rich Kentley Blyden, NP 

## 2021-01-28 NOTE — Progress Notes (Signed)
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 Proceed to physical therapy like we discussed.

## 2021-01-28 NOTE — Progress Notes (Signed)
Mild arthritis changes in the lumbar spine at L3-4.

## 2021-03-08 ENCOUNTER — Ambulatory Visit: Payer: 59 | Admitting: Family Medicine

## 2021-04-02 ENCOUNTER — Telehealth: Payer: 59 | Admitting: Nurse Practitioner

## 2021-04-02 DIAGNOSIS — R197 Diarrhea, unspecified: Secondary | ICD-10-CM

## 2021-04-02 MED ORDER — ONDANSETRON HCL 4 MG PO TABS: 4.0000 mg | ORAL_TABLET | Freq: Three times a day (TID) | ORAL | 0 refills | Status: DC | PRN

## 2021-04-02 NOTE — Progress Notes (Signed)
We are sorry that you are not feeling well.  Here is how we plan to help!  Based on what you have shared with me it looks like you have Acute Infectious Diarrhea.  Most cases of acute diarrhea are due to infections with a virus and are self-limited conditions lasting less than 14 days.  For your symptoms you may take Imodium 2 mg tablets that are over the counter at your local pharmacy. Take two tablet now and then one after each loose stool up to 6 a day.  Antibiotics are not needed for most people with diarrhea.  Optional: Zofran 4 mg 1 tablet every 8 hours as needed for nausea and vomiting    HOME CARE We recommend changing your diet to help with your symptoms for the next few days. Drink plenty of fluids that contain water salt and sugar. Sports drinks such as Gatorade may help.  You may try broths, soups, bananas, applesauce, soft breads, mashed potatoes or crackers.  You are considered infectious for as long as the diarrhea continues. Hand washing or use of alcohol based hand sanitizers is recommend. It is best to stay out of work or school until your symptoms stop.   GET HELP RIGHT AWAY If you have dark yellow colored urine or do not pass urine frequently you should drink more fluids.   If your symptoms worsen  If you feel like you are going to pass out (faint) You have a new problem  MAKE SURE YOU  Understand these instructions. Will watch your condition. Will get help right away if you are not doing well or get worse.  Thank you for choosing an e-visit.  Your e-visit answers were reviewed by a board certified advanced clinical practitioner to complete your personal care plan. Depending upon the condition, your plan could have included both over the counter or prescription medications.  Please review your pharmacy choice. Make sure the pharmacy is open so you can pick up prescription now. If there is a problem, you may contact your provider through [employer] and  have the prescription routed to another pharmacy.  Your safety is important to [place]. If you have drug allergies check your prescription carefully.   For the next 24 hours you can use MyChart to ask questions about today's visit, request a non-urgent call back, or ask for a work or school excuse. You will get an email in the next two days asking about your experience. I hope that your e-visit has been valuable and will speed your recovery.   I spent approximately 5 minutes reviewing the patient's history, current symptoms and coordinating their plan of care today.    Meds ordered this encounter  Medications   ondansetron (ZOFRAN) 4 MG tablet    Sig: Take 1 tablet (4 mg total) by mouth every 8 (eight) hours as needed for nausea or vomiting.    Dispense:  20 tablet    Refill:  0
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
## 2021-05-10 ENCOUNTER — Other Ambulatory Visit: Payer: Self-pay | Admitting: Family Medicine

## 2021-05-10 NOTE — Telephone Encounter (Signed)
Rx refill request approved per Dr. Corey's orders. 

## 2021-06-03 ENCOUNTER — Telehealth: Payer: Self-pay

## 2021-06-03 ENCOUNTER — Telehealth: Payer: Self-pay | Admitting: Nurse Practitioner

## 2021-06-03 DIAGNOSIS — J40 Bronchitis, not specified as acute or chronic: Secondary | ICD-10-CM

## 2021-06-03 MED ORDER — AZITHROMYCIN 250 MG PO TABS: ORAL_TABLET | ORAL | 0 refills | Status: AC

## 2021-06-03 MED ORDER — ALBUTEROL SULFATE (2.5 MG/3ML) 0.083% IN NEBU: 2.5000 mg | INHALATION_SOLUTION | Freq: Four times a day (QID) | RESPIRATORY_TRACT | 1 refills | Status: DC | PRN

## 2021-06-03 MED ORDER — BENZONATATE 100 MG PO CAPS: 100.0000 mg | ORAL_CAPSULE | Freq: Three times a day (TID) | ORAL | 0 refills | Status: DC | PRN

## 2021-06-03 NOTE — Progress Notes (Signed)
Virtual Visit Consent   Fleta Borgeson, you are scheduled for a virtual visit with a [facility] provider today.     Just as with appointments in the office, your consent must be obtained to participate.  Your consent will be active for this visit and any virtual visit you may have with one of our providers in the next 365 days.     If you have a MyChart account, a copy of this consent can be sent to you electronically.  All virtual visits are billed to your insurance company just like a traditional visit in the office.    As this is a virtual visit, video technology does not allow for your provider to perform a traditional examination.  This may limit your provider's ability to fully assess your condition.  If your provider identifies any concerns that need to be evaluated in person or the need to arrange testing (such as labs, EKG, etc.), we will make arrangements to do so.     Although advances in technology are sophisticated, we cannot ensure that it will always work on either your end or our end.  If the connection with a video visit is poor, the visit may have to be switched to a telephone visit.  With either a video or telephone visit, we are not always able to ensure that we have a secure connection.     I need to obtain your verbal consent now.   Are you willing to proceed with your visit today?    Winnie Umali has provided verbal consent on 06/03/2021 for a virtual visit (video or telephone).   Viviano Simas, FNP   Date: 06/03/2021 11:52 AM   Virtual Visit via Video Note   I, Viviano Simas, connected with  [name]  ([id number], 06-Mar-1981) on 06/03/21 at 12:00 PM EST by a video-enabled telemedicine application and verified that I am speaking with the correct person using two identifiers.  Location: Patient: Virtual Visit Location Patient: Home Provider: Virtual Visit Location Provider: Home Office   I discussed the limitations of evaluation and management by telemedicine and the  availability of in person appointments. The patient expressed understanding and agreed to proceed.    History of Present Illness: Breanna Valenzuela is a 41 y.o. who identifies as a female who was assigned female at birth, and is being seen today with complaints of chest congestion and expiratory wheezing.   She has had bronchitis in the past.   She has taken two negative COVID tests.   She also has a headaches and does have a history of migraines.   She also has nasal congestion and suffers from allergies  She denies fever, body aches or chills.   She has been feeling sick for the past 3 days.  She has needed inhalers in the past but does not currently have one.   She did use tobacco in the past, does on occasionally smoke casually.   Problems:  Patient Active Problem List   Diagnosis Date Noted   Chronic post-traumatic headache, not intractable 01/25/2021   Chronic midline low back pain without sciatica 01/25/2021   Neck pain, chronic 01/25/2021   Depression 04/25/2020   PTSD (post-traumatic stress disorder) 04/25/2020    Allergies:  Allergies  Allergen Reactions   Oxycodone Nausea And Vomiting and Other (See Comments)    Patient gets delirious, says its too strong    Tomato Itching, Swelling and Rash   Pineapple Itching, Swelling and Rash   Medications:  Current  Outpatient Medications:    diphenhydrAMINE (BENADRYL) 25 mg capsule, Take 25 mg by mouth every 6 (six) hours as needed for allergies., Disp: , Rfl:    nortriptyline (PAMELOR) 25 MG capsule, TAKE 1 CAPSULE(25 MG) BY MOUTH AT BEDTIME, Disp: 30 capsule, Rfl: 2   ondansetron (ZOFRAN) 4 MG tablet, Take 1 tablet (4 mg total) by mouth every 8 (eight) hours as needed for nausea or vomiting., Disp: 20 tablet, Rfl: 0   tiZANidine (ZANAFLEX) 4 MG tablet, Take 1 tablet (4 mg total) by mouth every 8 (eight) hours as needed for muscle spasms., Disp: 30 tablet, Rfl: 1  Observations/Objective: Patient is well-developed, well-nourished  in no acute distress.  Resting comfortably at home.  Head is normocephalic, atraumatic.  No labored breathing.  Speech is clear and coherent with logical content.  Patient is alert and oriented at baseline.    Assessment and Plan: 1. Bronchitis  - azithromycin (ZITHROMAX) 250 MG tablet; Take 2 tablets on day 1, then 1 tablet daily on days 2 through 5  Dispense: 6 tablet; Refill: 0 - albuterol (PROVENTIL) (2.5 MG/3ML) 0.083% nebulizer solution; Take 3 mLs (2.5 mg total) by nebulization every 6 (six) hours as needed for wheezing or shortness of breath.  Dispense: 150 mL; Refill: 1 - benzonatate (TESSALON) 100 MG capsule; Take 1 capsule (100 mg total) by mouth 3 (three) times daily as needed for cough.  Dispense: 30 capsule; Refill: 0     Follow Up Instructions: I discussed the assessment and treatment plan with the patient. The patient was provided an opportunity to ask questions and all were answered. The patient agreed with the plan and demonstrated an understanding of the instructions.  A copy of instructions were sent to the patient via MyChart unless otherwise noted below.    The patient was advised to call back or seek an in-person evaluation if the symptoms worsen or if the condition fails to improve as anticipated.  Time:  I spent 10 minutes with the patient via telehealth technology discussing the above problems/concerns.    Viviano Simas, FNP

## 2021-10-06 ENCOUNTER — Telehealth: Payer: No Typology Code available for payment source | Admitting: Physician Assistant

## 2021-10-06 DIAGNOSIS — K1120 Sialoadenitis, unspecified: Secondary | ICD-10-CM

## 2021-10-07 NOTE — Progress Notes (Signed)
Because you are having bleeding as well as the stones come out, I feel your condition warrants further evaluation and I recommend that you be seen in a face to face visit.   NOTE: There will be NO CHARGE for this eVisit   If you are having a true medical emergency please call 911.      For an urgent face to face visit, [facility] has seven urgent care centers for your convenience:     [facility] at [facility] Directions [phone] [address] [place], [place] [address]    [facility]) Get Driving Directions [phone] [address] [place], [place] [address]  [facility]) Get Driving Directions [phone] [address] [place],  [place]  [address]  [facility] [place] [facility] at [facility] Directions  [phone] [address] [place],  [place] [address]   [facility] at [facility] Get Driving Directions [phone] [address] [place], [place] [address]   [facility] at [facility] Get Driving Directions  [phone] [address] [place], [place] [address]   [facility] Urgent Care at [facility] Directions [phone] [address] [place], [place] [address]  Your MyChart E-visit questionnaire answers were reviewed by a board certified advanced clinical practitioner to complete your personal care plan based on your specific symptoms.  Thank you for using e-Visits.   I provided 5 minutes of non face-to-face time during this encounter for chart review and documentation.

## 2022-02-04 ENCOUNTER — Other Ambulatory Visit: Payer: Self-pay | Admitting: Family Medicine
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
## 2022-03-04 ENCOUNTER — Telehealth: Payer: No Typology Code available for payment source | Admitting: Physician Assistant

## 2022-03-04 DIAGNOSIS — L0292 Furuncle, unspecified: Secondary | ICD-10-CM

## 2022-03-05 MED ORDER — CEPHALEXIN 500 MG PO CAPS: 500.0000 mg | ORAL_CAPSULE | Freq: Four times a day (QID) | ORAL | 0 refills | Status: DC

## 2022-03-05 NOTE — Progress Notes (Signed)
E Visit for Skin Abscess  We are sorry that you are not feeling well. Here is how we plan to help!  Based on what you shared with me it looks like you have skin abscesses (Boils)  I have prescribed:  Keflex 500mg  take one by mouth four times a day for 5 days  HOME CARE:  Take your medications as ordered and take all of them, even if the skin irritation appears to be healing.   GET HELP RIGHT AWAY IF:  Symptoms that don't begin to go away within 48 hours. Severe redness persists or worsens If the area turns color, spreads or swells. If it blisters and opens, develops yellow-brown crust or bleeds. You develop a fever or chills. If the pain increases or becomes unbearable.  Are unable to keep fluids and food down.  MAKE SURE YOU   Understand these instructions. Will watch your condition. Will get help right away if you are not doing well or get worse.  Thank you for choosing an e-visit.  Your e-visit answers were reviewed by a board certified advanced clinical practitioner to complete your personal care plan. Depending upon the condition, your plan could have included both over the counter or prescription medications.  Please review your pharmacy choice. Make sure the pharmacy is open so you can pick up prescription now. If there is a problem, you may contact your provider through and have the prescription routed to another pharmacy.  Your safety is important to [employer]. If you have drug allergies check your prescription carefully.   For the next 24 hours you can use MyChart to ask questions about today's visit, request a non-urgent call back, or ask for a work or school excuse. You will get an email in the next two days asking about your experience. I hope that your e-visit has been valuable and will speed your recovery.  I have spent 5 minutes in review of e-visit questionnaire, review and updating patient chart, medical decision making and response to patient.    [place], PA-C

## 2022-03-30 ENCOUNTER — Emergency Department (HOSPITAL_COMMUNITY)
Admission: EM | Admit: 2022-03-30 | Discharge: 2022-03-30 | Disposition: A | Discharge status: Home / Self Care | Payer: Commercial Managed Care - HMO | Attending: Emergency Medicine | Admitting: Emergency Medicine

## 2022-03-30 ENCOUNTER — Emergency Department (HOSPITAL_COMMUNITY): Payer: Commercial Managed Care - HMO

## 2022-03-30 DIAGNOSIS — Z20822 Contact with and (suspected) exposure to covid-19: Secondary | ICD-10-CM | POA: Diagnosis not present

## 2022-03-30 DIAGNOSIS — M791 Myalgia, unspecified site: Secondary | ICD-10-CM | POA: Insufficient documentation

## 2022-03-30 DIAGNOSIS — R051 Acute cough: Secondary | ICD-10-CM | POA: Insufficient documentation

## 2022-03-30 DIAGNOSIS — Z113 Encounter for screening for infections with a predominantly sexual mode of transmission: Secondary | ICD-10-CM | POA: Insufficient documentation

## 2022-03-30 DIAGNOSIS — R509 Fever, unspecified: Secondary | ICD-10-CM | POA: Insufficient documentation

## 2022-03-30 DIAGNOSIS — R0982 Postnasal drip: Secondary | ICD-10-CM | POA: Diagnosis not present

## 2022-03-30 DIAGNOSIS — R0981 Nasal congestion: Secondary | ICD-10-CM | POA: Diagnosis not present

## 2022-03-30 DIAGNOSIS — J029 Acute pharyngitis, unspecified: Secondary | ICD-10-CM | POA: Insufficient documentation

## 2022-03-30 LAB — BASIC METABOLIC PANEL
Anion gap: 15 (ref 5–15)
BUN: 9 mg/dL (ref 6–20)
CO2: 24 mmol/L (ref 22–32)
Calcium: 9.1 mg/dL (ref 8.9–10.3)
Chloride: 99 mmol/L (ref 98–111)
Creatinine, Ser: 0.93 mg/dL (ref 0.44–1.00)
GFR, Estimated: >60 mL/min (ref >60)
Glucose, Bld: 105 mg/dL — ABNORMAL HIGH (ref 70–99)
Potassium: 3.7 mmol/L (ref 3.5–5.1)
Sodium: 138 mmol/L (ref 135–145)

## 2022-03-30 LAB — TROPONIN I (HIGH SENSITIVITY)
Troponin I (High Sensitivity): 7 ng/L (ref <18)
Troponin I (High Sensitivity): 3 ng/L (ref <18)

## 2022-03-30 LAB — CBC WITH DIFFERENTIAL/PLATELET
Abs Immature Granulocytes: 0.03 10*3/uL (ref 0.00–0.07)
Basophils Absolute: 0 10*3/uL (ref 0.0–0.1)
Basophils Relative: 1 %
Eosinophils Absolute: 0.2 10*3/uL (ref 0.0–0.5)
Eosinophils Relative: 3 %
HCT: 37.9 % (ref 36.0–46.0)
Hemoglobin: 12.2 g/dL (ref 12.0–15.0)
Immature Granulocytes: 0 %
Lymphocytes Relative: 29 %
Lymphs Abs: 2.3 10*3/uL (ref 0.7–4.0)
MCH: 26.8 pg (ref 26.0–34.0)
MCHC: 32.2 g/dL (ref 30.0–36.0)
MCV: 83.3 fL (ref 80.0–100.0)
Monocytes Absolute: 0.8 10*3/uL (ref 0.1–1.0)
Monocytes Relative: 10 %
Neutro Abs: 4.7 10*3/uL (ref 1.7–7.7)
Neutrophils Relative %: 57 %
Platelets: 284 10*3/uL (ref 150–400)
RBC: 4.55 MIL/uL (ref 3.87–5.11)
RDW: 14.2 % (ref 11.5–15.5)
WBC: 8.1 10*3/uL (ref 4.0–10.5)
nRBC: 0 % (ref 0.0–0.2)

## 2022-03-30 LAB — GROUP A STREP BY PCR: Group A Strep by PCR: NOT DETECTED

## 2022-03-30 LAB — RESP PANEL BY RT-PCR (RSV, FLU A&B, COVID)  RVPGX2
Influenza A by PCR: NEGATIVE
Influenza B by PCR: NEGATIVE
Resp Syncytial Virus by PCR: NEGATIVE
SARS Coronavirus 2 by RT PCR: NEGATIVE

## 2022-03-30 MED ORDER — IBUPROFEN 600 MG PO TABS: 600.0000 mg | ORAL_TABLET | Freq: Four times a day (QID) | ORAL | 0 refills | Status: DC | PRN

## 2022-03-30 MED ORDER — OXYMETAZOLINE HCL 0.05 % NA SOLN: 1.0000 | Freq: Two times a day (BID) | NASAL | 0 refills | Status: AC

## 2022-03-30 MED ORDER — CEFTRIAXONE SODIUM 500 MG IJ SOLR
500.0000 mg | Freq: Once | INTRAMUSCULAR | Status: AC
  Administered 2022-03-30: 500 mg via INTRAMUSCULAR
  Filled 2022-03-30: qty 500

## 2022-03-30 MED ORDER — LIDOCAINE HCL (PF) 1 % IJ SOLN
1.0000 mL | Freq: Once | INTRAMUSCULAR | Status: AC
  Administered 2022-03-30: 1 mL
  Filled 2022-03-30: qty 5

## 2022-03-30 MED ORDER — GUAIFENESIN 100 MG/5ML PO LIQD: 5.0000 mL | ORAL | 0 refills | Status: DC | PRN

## 2022-03-30 MED ORDER — ACETAMINOPHEN 325 MG PO TABS: 650.0000 mg | ORAL_TABLET | Freq: Four times a day (QID) | ORAL | 0 refills | Status: DC | PRN

## 2022-03-30 MED ORDER — DOXYCYCLINE HYCLATE 100 MG PO TABS: 100.0000 mg | ORAL_TABLET | Freq: Two times a day (BID) | ORAL | 0 refills | Status: DC

## 2022-03-30 NOTE — ED Triage Notes (Signed)
Pt reports nasal congestion, cough that is productive, chills started on 18th. She took covid test and it was negative. Pt reports she is getting worse when chest pain started this morning.

## 2022-03-30 NOTE — Discharge Instructions (Addendum)
It was a pleasure caring for you today in the emergency department. ° °Please return to the emergency department for any worsening or worrisome symptoms. ° ° °

## 2022-03-30 NOTE — ED Provider Triage Note (Signed)
Emergency Medicine Provider Triage Evaluation Note  Breanna Valenzuela , a 41 y.o. female  was evaluated in triage.  Pt complains of chest pain.  Has been sick for about 1 week now.  Did home covid test that was negative but feel she is getting worse.  Denies fever.  Chest pain began this morning.  Review of Systems  Positive: Chest pain, cough Negative: fever  Physical Exam  BP 116/82 (BP Location: Right Arm)   Pulse 97   Temp 99.2 F (37.3 C)   Resp 20   LMP 03/30/2022   SpO2 96%  Gen:   Awake, no distress   Resp:  Normal effort  MSK:   Moves extremities without difficulty  Other:    Medical Decision Making  Medically screening exam initiated at 5:47 AM.  Appropriate orders placed.  [name] was informed that the remainder of the evaluation will be completed by another provider, this initial triage assessment does not replace that evaluation, and the importance of remaining in the ED until their evaluation is complete.  Chest pain, cough, bodyaches.  EKG, labs, RVP, CXR.   Garlon Hatchet, PA-C 03/30/22 [phone]

## 2022-03-30 NOTE — ED Provider Notes (Signed)
[facility] EMERGENCY DEPARTMENT Provider Note   CSN: [id number] Arrival date & time: 03/30/22  0737     History {Add pertinent medical, surgical, social history, OB history to HPI:1} Chief Complaint  Patient presents with   Fatigue    Breanna Valenzuela is a 41 y.o. female.  Patient as above with significant medical history as below, including pap smear abnormality who presents to the ED with complaint of uri s/s Pt has been feeling unwell for around 5 days Cough, congestion, post nasal drip, body aches, subjective fever/chills 3-4 days ago resolved, diarrhea 3 days ago now resolved.  She has been taking otc cold remedies with mild improvement to her symptoms She is tolerating po but has a sore throat No vomiting, no urination change  She is also concerned she was exposed to STI around 2 weeks ago with girlfriend, found to have gonorrhea/chlamydia      Past Medical History:  Diagnosis Date   Allergy    Pap smear abnormality of cervix     No past surgical history on file.   HPI     Home Medications Prior to Admission medications   Medication Sig Start Date End Date Taking? Authorizing Provider  acetaminophen (TYLENOL) 325 MG tablet Take 2 tablets (650 mg total) by mouth every 6 (six) hours as needed. 03/30/22  Yes Sloan Leiter, DO  doxycycline (VIBRA-TABS) 100 MG tablet Take 1 tablet (100 mg total) by mouth 2 (two) times daily. 03/30/22  Yes Tanda Rockers A, DO  guaiFENesin (ROBITUSSIN) 100 MG/5ML liquid Take 5 mLs by mouth every 4 (four) hours as needed for cough or to loosen phlegm. 03/30/22  Yes Tanda Rockers A, DO  ibuprofen (ADVIL) 600 MG tablet Take 1 tablet (600 mg total) by mouth every 6 (six) hours as needed. 03/30/22  Yes Tanda Rockers A, DO  oxymetazoline (AFRIN NASAL SPRAY) 0.05 % nasal spray Place 1 spray into both nostrils 2 (two) times daily for 3 days. 03/30/22 04/02/22 Yes Sloan Leiter, DO  cephALEXin (KEFLEX) 500 MG capsule Take 1 capsule  (500 mg total) by mouth 4 (four) times daily. 03/05/22   Margaretann Loveless, PA-C  nortriptyline (PAMELOR) 25 MG capsule TAKE 1 CAPSULE(25 MG) BY MOUTH AT BEDTIME 05/10/21   Rodolph Bong, MD  tiZANidine (ZANAFLEX) 4 MG tablet TAKE 1 TABLET(4 MG) BY MOUTH EVERY 8 HOURS AS NEEDED FOR MUSCLE SPASMS 02/06/22   Rodolph Bong, MD      Allergies    Oxycodone, Tomato, and Pineapple    Review of Systems   Review of Systems  Physical Exam Updated Vital Signs BP 111/69   Pulse 89   Temp 98.3 F (36.8 C) (Oral)   Resp 18   LMP 03/30/2022   SpO2 100%  Physical Exam  ED Results / Procedures / Treatments   Labs (all labs ordered are listed, but only abnormal results are displayed) Labs Reviewed  BASIC METABOLIC PANEL - Abnormal; Notable for the following components:      Result Value   Glucose, Bld 105 (*)    All other components within normal limits  RESP PANEL BY RT-PCR (RSV, FLU A&B, COVID)  RVPGX2  GROUP A STREP BY PCR  CBC WITH DIFFERENTIAL/PLATELET  GC/CHLAMYDIA PROBE AMP ([place]) NOT AT [facility]  TROPONIN I (HIGH SENSITIVITY)  TROPONIN I (HIGH SENSITIVITY)    EKG None  Radiology DG Chest 2 View  Result Date: 03/30/2022 CLINICAL DATA:  Coughing with chest pain EXAM: CHEST -  2 VIEW COMPARISON:  Ten days ago FINDINGS: Low volume chest. There is no edema, consolidation, effusion, or pneumothorax. Normal heart size and mediastinal contours. IMPRESSION: Stable low volume chest.  No acute finding Electronically Signed   By: Tiburcio Pea M.D.   On: 03/30/2022 06:23    Procedures Procedures  {Document cardiac monitor, telemetry assessment procedure when appropriate:1}  Medications Ordered in ED Medications  cefTRIAXone (ROCEPHIN) injection 500 mg (has no administration in time range)  lidocaine (PF) (XYLOCAINE) 1 % injection 1-2.1 mL (has no administration in time range)    ED Course/ Medical Decision Making/ A&P                           Medical Decision  Making Risk OTC drugs. Prescription drug management.   ***  {Document critical care time when appropriate:1} {Document review of labs and clinical decision tools ie heart score, Chads2Vasc2 etc:1}  {Document your independent review of radiology images, and any outside records:1} {Document your discussion with family members, caretakers, and with consultants:1} {Document social determinants of health affecting pt's care:1} {Document your decision making why or why not admission, treatments were needed:1} Final Clinical Impression(s) / ED Diagnoses Final diagnoses:  Pharyngitis, unspecified etiology  Acute cough  Routine screening for STI (sexually transmitted infection)    Rx / DC Orders ED Discharge Orders          Ordered    doxycycline (VIBRA-TABS) 100 MG tablet  2 times daily        03/30/22 1154    ibuprofen (ADVIL) 600 MG tablet  Every 6 hours PRN        03/30/22 1156    acetaminophen (TYLENOL) 325 MG tablet  Every 6 hours PRN        03/30/22 1156    oxymetazoline (AFRIN NASAL SPRAY) 0.05 % nasal spray  2 times daily        03/30/22 1156    guaiFENesin (ROBITUSSIN) 100 MG/5ML liquid  Every 4 hours PRN        03/30/22 1156

## 2022-04-01 LAB — GC/CHLAMYDIA PROBE AMP (~~LOC~~) NOT AT ARMC
Chlamydia: NEGATIVE
Comment: NEGATIVE
Comment: NORMAL
Neisseria Gonorrhea: NEGATIVE

## 2022-09-05 ENCOUNTER — Ambulatory Visit (HOSPITAL_COMMUNITY)
Admission: EM | Admit: 2022-09-05 | Discharge: 2022-09-05 | Disposition: A | Discharge status: Home / Self Care | Payer: No Typology Code available for payment source | Attending: Physician Assistant | Admitting: Physician Assistant

## 2022-09-05 ENCOUNTER — Encounter (HOSPITAL_COMMUNITY): Payer: Self-pay | Admitting: *Deleted

## 2022-09-05 DIAGNOSIS — R5383 Other fatigue: Secondary | ICD-10-CM | POA: Diagnosis present

## 2022-09-05 DIAGNOSIS — E162 Hypoglycemia, unspecified: Secondary | ICD-10-CM | POA: Diagnosis present

## 2022-09-05 DIAGNOSIS — R531 Weakness: Secondary | ICD-10-CM | POA: Diagnosis present

## 2022-09-05 LAB — CBC WITH DIFFERENTIAL/PLATELET
Abs Immature Granulocytes: 0.02 10*3/uL (ref 0.00–0.07)
Basophils Absolute: 0.1 10*3/uL (ref 0.0–0.1)
Basophils Relative: 1 %
Eosinophils Absolute: 0.2 10*3/uL (ref 0.0–0.5)
Eosinophils Relative: 4 %
HCT: 43.8 % (ref 36.0–46.0)
Hemoglobin: 14 g/dL (ref 12.0–15.0)
Immature Granulocytes: 0 %
Lymphocytes Relative: 42 %
Lymphs Abs: 2.5 10*3/uL (ref 0.7–4.0)
MCH: 27 pg (ref 26.0–34.0)
MCHC: 32 g/dL (ref 30.0–36.0)
MCV: 84.4 fL (ref 80.0–100.0)
Monocytes Absolute: 0.5 10*3/uL (ref 0.1–1.0)
Monocytes Relative: 8 %
Neutro Abs: 2.7 10*3/uL (ref 1.7–7.7)
Neutrophils Relative %: 45 %
Platelets: 219 10*3/uL (ref 150–400)
RBC: 5.19 MIL/uL — ABNORMAL HIGH (ref 3.87–5.11)
RDW: 14.2 % (ref 11.5–15.5)
WBC: 5.9 10*3/uL (ref 4.0–10.5)
nRBC: 0 % (ref 0.0–0.2)

## 2022-09-05 LAB — COMPREHENSIVE METABOLIC PANEL
ALT: 17 U/L (ref 0–44)
AST: 19 U/L (ref 15–41)
Albumin: 3.8 g/dL (ref 3.5–5.0)
Alkaline Phosphatase: 61 U/L (ref 38–126)
Anion gap: 10 (ref 5–15)
BUN: 12 mg/dL (ref 6–20)
CO2: 26 mmol/L (ref 22–32)
Calcium: 9.3 mg/dL (ref 8.9–10.3)
Chloride: 100 mmol/L (ref 98–111)
Creatinine, Ser: 0.78 mg/dL (ref 0.44–1.00)
GFR, Estimated: >60 mL/min (ref >60)
Glucose, Bld: 80 mg/dL (ref 70–99)
Potassium: 4.2 mmol/L (ref 3.5–5.1)
Sodium: 136 mmol/L (ref 135–145)
Total Bilirubin: 0.4 mg/dL (ref 0.3–1.2)
Total Protein: 7.5 g/dL (ref 6.5–8.1)

## 2022-09-05 LAB — POCT URINALYSIS DIP (MANUAL ENTRY)
Bilirubin, UA: NEGATIVE
Glucose, UA: NEGATIVE mg/dL
Ketones, POC UA: NEGATIVE mg/dL
Leukocytes, UA: NEGATIVE
Nitrite, UA: NEGATIVE
Protein Ur, POC: NEGATIVE mg/dL
Spec Grav, UA: 1.025 (ref 1.010–1.025)
Urobilinogen, UA: 0.2 E.U./dL
pH, UA: 6.5 (ref 5.0–8.0)

## 2022-09-05 LAB — TSH: TSH: 2.375 u[IU]/mL (ref 0.350–4.500)

## 2022-09-05 LAB — POCT URINE PREGNANCY: Preg Test, Ur: NEGATIVE

## 2022-09-05 LAB — VITAMIN D 25 HYDROXY (VIT D DEFICIENCY, FRACTURES): Vit D, 25-Hydroxy: 9.94 ng/mL — ABNORMAL LOW (ref 30–100)

## 2022-09-05 LAB — POCT FASTING CBG KUC MANUAL ENTRY: POCT Glucose (KUC): 89 mg/dL (ref 70–99)

## 2022-09-05 NOTE — Discharge Instructions (Signed)
Your blood sugar was appropriate today.  Your urine had a little bit of blood in it but was otherwise normal.  When you follow-up with primary care please have them repeat this to ensure that this goes away.  We will contact you if any of your lab work is abnormal.  Monitor your MyChart for these results.  Make sure that you are drinking plenty of fluid.  As we discussed you should eat a small snack or meal every 2 hours while awake to prevent low blood sugars and see if this helps improve your symptoms.  You are scheduled for a primary care on 09/10/2022 and keep this appointment for additional evaluation and workup.  If you have any worsening or changing symptoms please return for reevaluation.

## 2022-09-05 NOTE — ED Notes (Signed)
PCP appt 09/25/22 pt aware and will print on AVS at end of visit

## 2022-09-05 NOTE — ED Triage Notes (Signed)
Pt states she has had low blood sugar x 3 days. She checked BS at work at it was 78 2 hours after a meal per pt.

## 2022-09-05 NOTE — ED Provider Notes (Signed)
[facility]    CSN: [id number] Arrival date & time: 09/05/22  1805      History   Chief Complaint Chief Complaint  Patient presents with   Blood Sugar Problem    HPI Breanna Valenzuela is a 42 y.o. female.   Patient presents today with several concerns.  She reports that for the past several days she has had intermittent hypoglycemic episodes.  She reports that she checked her blood sugar yesterday and this was 78.  At that point she was feeling poorly and had blurred vision, nausea, shakiness, malaise.  She also reports that she has been feeling generally weak for the past few months.  She is concerned that she may be becoming diabetic as she has been told that she is prediabetic in the past.  She does not take any diabetic medications.  She denies history of thyroid condition.  She does report having irregular menstrual cycles but denies any significant menorrhagia or other blood loss including melena or hematochezia.  She does report eating a poor diet.  Often she will only have 1 or 2 meals per day and often will skip meals when she is at work.  She is drinking plenty of fluid.  Denies any recent illness.  She does not currently have a primary care but is interested in establishing with someone.    Past Medical History:  Diagnosis Date   Allergy    Pap smear abnormality of cervix     Patient Active Problem List   Diagnosis Date Noted   Chronic post-traumatic headache, not intractable 01/25/2021   Chronic midline low 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 No pertinent surgical history.  OB History   No obstetric history on file.      Home Medications    Prior to Admission medications   Not on File    Family History Family History  Problem Relation Age of Onset   Diabetes Mother    Hyperlipidemia Mother    Mental illness Brother     Hyperlipidemia Brother    Mental illness Brother     Social History Social History   Tobacco Use   Smoking status: Former   Smokeless tobacco: Never  Building services engineer Use: Former  Substance Use Topics   Alcohol use: Yes    Alcohol/week: 0.0 standard drinks of alcohol    Comment: occ   Drug use: No     Allergies   Oxycodone, Tomato, and Pineapple   Review of Systems Review of Systems  Constitutional:  Positive for activity change and fatigue. Negative for appetite change and fever.  Eyes:  Positive for visual disturbance.  Respiratory:  Negative for cough and shortness of breath.   Cardiovascular:  Negative for chest pain.  Gastrointestinal:  Negative for abdominal pain, diarrhea, nausea and vomiting.  Neurological:  Negative for dizziness, light-headedness and headaches.     Physical Exam Triage Vital Signs ED Triage Vitals  Enc Vitals Group     BP 09/05/22 1820 129/84     Pulse Rate 09/05/22 1820 78     Resp 09/05/22 1820 18     Temp 09/05/22 1820 98.4 F (36.9 C)     Temp Source 09/05/22 1820 Oral     SpO2 09/05/22 1820 97 %     Weight --      Height --  Head Circumference --      Peak Flow --      Pain Score 09/05/22 1818 0     Pain Loc --      Pain Edu? --      Excl. in GC? --    No data found.  Updated Vital Signs BP 129/84 (BP Location: Left Arm)   Pulse 78   Temp 98.4 F (36.9 C) (Oral)   Resp 18   LMP 07/22/2022 (Approximate)   SpO2 97%   Visual Acuity Right Eye Distance:   Left Eye Distance:   Bilateral Distance:    Right Eye Near:   Left Eye Near:    Bilateral Near:     Physical Exam Vitals reviewed.  Constitutional:      General: She is awake. She is not in acute distress.    Appearance: Normal appearance. She is well-developed. She is not ill-appearing.     Comments: Very pleasant appears stated age in no acute distress sitting comfortably in exam room  HENT:     Head: Normocephalic and atraumatic.      Mouth/Throat:     Mouth: Mucous membranes are moist.     Pharynx: Uvula midline. No oropharyngeal exudate or posterior oropharyngeal erythema.  Cardiovascular:     Rate and Rhythm: Normal rate and regular rhythm.     Heart sounds: Normal heart sounds, S1 normal and S2 normal. No murmur heard. Pulmonary:     Effort: Pulmonary effort is normal.     Breath sounds: Normal breath sounds. No wheezing, rhonchi or rales.     Comments: Clear to auscultation bilaterally Musculoskeletal:     Right lower leg: No edema.     Left lower leg: No edema.     Comments: Strength 5/5 bilateral upper and lower extremities  Psychiatric:        Behavior: Behavior is cooperative.      UC Treatments / Results  Labs (all labs ordered are listed, but only abnormal results are displayed) Labs Reviewed  POCT URINALYSIS DIP (MANUAL ENTRY) - Abnormal; Notable for the following components:      Result Value   Blood, UA trace-lysed (*)    All other components within normal limits  POCT FASTING CBG K[facility] MANUAL ENTRY - Normal  CBC WITH DIFFERENTIAL/PLATELET  COMPREHENSIVE METABOLIC PANEL  HEMOGLOBIN A1C  TSH  VITAMIN D 25 HYDROXY (VIT D DEFICIENCY, FRACTURES)  POCT URINE PREGNANCY    EKG   Radiology No results found.  Procedures Procedures (including critical care time)  Medications Ordered in UC Medications - No data to display  Initial Impression / Assessment and Plan / UC Course  I have reviewed the triage vital signs and the nursing notes.  Pertinent labs & imaging results that were available during my care of the patient were reviewed by me and considered in my medical decision making (see chart for details).     Patient is well-appearing, afebrile, nontoxic, nontachycardic.  Random glucose was appropriate at 89.  We did discuss that hypoglycemia typically does not start until 70 but she reports having associated symptoms.  Recommended that she eat small frequent meals and push fluids to  prevent recurrent symptoms.  UA was obtained that showed trace blood but was otherwise normal with no evidence of dehydration.  Recommend that she follow-up with PCP and have this repeated to ensure that blood noted today resolves.  Urine pregnancy was negative.  Insulin levels and other testing were deferred as she has not had  a true hypoglycemic episode.  Given her associated malaise and fatigue with generalized weakness basic labs were obtained including CBC, CMP, TSH, A1c, vitamin D.  We will contact her if any of this is abnormal and we need to arrange additional treatment.  She does not currently have a primary care so she was established with someone through open scheduling with her appointment on 09/10/2022.  Discussed that it is very poor and she keeps this appointment for additional evaluation and management.  All questions were answered to patient satisfaction.  Discussed that if she has any worsening symptoms including hypoglycemic episodes, loss of consciousness, weakness, nausea/vomiting, confusion she needs to be seen immediately.  Strict return precautions given.   Final Clinical Impressions(s) / UC Diagnoses   Final diagnoses:  Fatigue, unspecified type  Generalized weakness  Low blood sugar reading     Discharge Instructions      Your blood sugar was appropriate today.  Your urine had a little bit of blood in it but was otherwise normal.  When you follow-up with primary care please have them repeat this to ensure that this goes away.  We will contact you if any of your lab work is abnormal.  Monitor your MyChart for these results.  Make sure that you are drinking plenty of fluid.  As we discussed you should eat a small snack or meal every 2 hours while awake to prevent low blood sugars and see if this helps improve your symptoms.  You are scheduled for a primary care on 09/10/2022 and keep this appointment for additional evaluation and workup.  If you have any worsening or changing  symptoms please return for reevaluation.     ED Prescriptions   None    PDMP not reviewed this encounter.   Jeani Hawking, PA-C 09/05/22 1908

## 2022-09-07 ENCOUNTER — Telehealth (HOSPITAL_COMMUNITY): Payer: Self-pay | Admitting: Physician Assistant

## 2022-09-07 MED ORDER — VITAMIN D (ERGOCALCIFEROL) 1.25 MG (50000 UNIT) PO CAPS: 50000.0000 [IU] | ORAL_CAPSULE | ORAL | 0 refills | Status: AC

## 2022-09-07 NOTE — Telephone Encounter (Signed)
Patient had low vitamin D level.  Prescription supplementation was sent to pharmacy to be taken weekly for 6 weeks.  See result note for additional information.

## 2022-09-08 LAB — HEMOGLOBIN A1C
Hgb A1c MFr Bld: 6 % — ABNORMAL HIGH (ref 4.8–5.6)
Mean Plasma Glucose: 126 mg/dL

## 2022-09-10 ENCOUNTER — Ambulatory Visit (INDEPENDENT_AMBULATORY_CARE_PROVIDER_SITE_OTHER): Payer: No Typology Code available for payment source | Admitting: Internal Medicine

## 2022-09-10 ENCOUNTER — Encounter: Payer: Self-pay | Admitting: Internal Medicine

## 2022-09-10 VITALS — BP 110/80 | HR 82 | Temp 98.0°F | Ht 64.5 in | Wt 219.6 lb

## 2022-09-10 DIAGNOSIS — R7302 Impaired glucose tolerance (oral): Secondary | ICD-10-CM

## 2022-09-10 DIAGNOSIS — E559 Vitamin D deficiency, unspecified: Secondary | ICD-10-CM

## 2022-09-10 DIAGNOSIS — Z6837 Body mass index (BMI) 37.0-37.9, adult: Secondary | ICD-10-CM

## 2022-09-10 DIAGNOSIS — E669 Obesity, unspecified: Secondary | ICD-10-CM

## 2022-09-10 DIAGNOSIS — Z124 Encounter for screening for malignant neoplasm of cervix: Secondary | ICD-10-CM

## 2022-09-10 NOTE — Assessment & Plan Note (Signed)
Was started on high dose vit D supplementation. Recheck levels next visit.

## 2022-09-10 NOTE — Assessment & Plan Note (Signed)
A1c was 6.0. Advised Lifestyle modifications: healthy eating, weight loss, increased physical activity. Recheck at next visit.

## 2022-09-10 NOTE — Assessment & Plan Note (Signed)
Discussed healthy lifestyle, including increased physical activity and better food choices to promote weight loss.  

## 2022-09-10 NOTE — Progress Notes (Signed)
New Patient Office Visit     CC/Reason for Visit: Establish care, ED follow-up Previous PCP: Unknown Last Visit: Unknown  HPI: Breanna Valenzuela is a 42 y.o. female who is coming in today for the above mentioned reasons. Past Medical History is significant for: Obesity, impaired glucose tolerance, vitamin D deficiency.  She recently had a visit to the emergency department because she she had symptoms of low blood glucose.  Her sugar at that time was 78.  She improved after some orange juice.  She believes she has PCOS because of facial hirsutism.  She is requesting GYN referral.  She quit smoking about a month ago.  Drinks alcohol only occasionally, no known drug allergies, no past surgical history.  Family history significant for mother had a CVA.  She is overdue for all age-appropriate cancer screening.   Past Medical/Surgical History: Past Medical History:  Diagnosis Date   Allergy    COPD (chronic obstructive pulmonary disease) (HCC) Unknown   I have had Bronchitis   Depression    GERD (gastroesophageal reflux disease) Unknown   Occasionally happens   Hypertension Unknown   Has Happened over the years   IGT (impaired glucose tolerance)    Pap smear abnormality of cervix    Vitamin D deficiency     History reviewed. No pertinent surgical history.  Social History:  reports that she has quit smoking. Her smoking use included cigarettes. She has never used smokeless tobacco. She reports current alcohol use. She reports that she does not use drugs.  Allergies: Allergies  Allergen Reactions   Oxycodone Nausea And Vomiting and Other (See Comments)    Patient gets delirious, says its too strong    Tomato Itching, Swelling and Rash   Pineapple Itching, Swelling and Rash    Family History:  Family History  Problem Relation Age of Onset   Diabetes Mother    Hyperlipidemia Mother    Arthritis Mother    Hearing loss Mother    Obesity Mother    Stroke Mother    Mental illness  Brother    Hyperlipidemia Brother    Asthma Brother    Obesity Brother    Mental illness Brother    Depression Brother    Obesity Brother    Early death Maternal Grandmother    Diabetes Maternal Aunt    Miscarriages / Stillbirths Maternal Aunt    Obesity Maternal Aunt      Current Outpatient Medications:    Vitamin D, Ergocalciferol, (DRISDOL) 1.25 MG (50000 UNIT) CAPS capsule, Take 1 capsule (50,000 Units total) by mouth every 7 (seven) days., Disp: 6 capsule, Rfl: 0  Review of Systems:  Negative except as indicated in HPI.   Physical Exam: Vitals:   09/10/22 1542  BP: 110/80  Pulse: 82  Temp: 98 F (36.7 C)  TempSrc: Oral  SpO2: 99%  Weight: 219 lb 9.6 oz (99.6 kg)  Height: 5' 4.5" (1.638 m)   Body mass index is 37.11 kg/m.  Physical Exam Vitals reviewed.  Constitutional:      Appearance: Normal appearance.  HENT:     Head: Normocephalic and atraumatic.  Eyes:     Conjunctiva/sclera: Conjunctivae normal.     Pupils: Pupils are equal, round, and reactive to light.  Cardiovascular:     Rate and Rhythm: Normal rate and regular rhythm.  Pulmonary:     Effort: Pulmonary effort is normal.     Breath sounds: Normal breath sounds.  Skin:    General: Skin  is warm and dry.  Neurological:     General: No focal deficit present.     Mental Status: She is alert and oriented to person, place, and time.  Psychiatric:        Mood and Affect: Mood normal.        Behavior: Behavior normal.        Thought Content: Thought content normal.        Judgment: Judgment normal.       Impression and Plan:  Screening for cervical cancer -     Ambulatory referral to Obstetrics / Gynecology  IGT (impaired glucose tolerance) Assessment & Plan: A1c was 6.0. Advised Lifestyle modifications: healthy eating, weight loss, increased physical activity. Recheck at next visit.     Vitamin D deficiency Assessment & Plan: Was started on high dose vit D supplementation. Recheck  levels next visit.   Obesity (BMI 35.0-39.9 without comorbidity) Assessment & Plan: -Discussed healthy lifestyle, including increased physical activity and better food choices to promote weight loss.     Time spent: 46 minutes reviewing chart, interviewing and examining patient and formulating plan of care.  Spent a lot of time discussing lifestyle changes.     Chaya Jan, MD [facility]

## 2022-09-25 ENCOUNTER — Ambulatory Visit: Payer: Self-pay | Admitting: Family

## 2023-01-28 ENCOUNTER — Telehealth: Payer: No Typology Code available for payment source | Admitting: Physician Assistant

## 2023-01-28 DIAGNOSIS — R6889 Other general symptoms and signs: Secondary | ICD-10-CM

## 2023-01-28 MED ORDER — OSELTAMIVIR PHOSPHATE 75 MG PO CAPS: 75.0000 mg | ORAL_CAPSULE | Freq: Two times a day (BID) | ORAL | 0 refills | Status: DC

## 2023-01-28 NOTE — Progress Notes (Signed)
I have spent 5 minutes in review of e-visit questionnaire, review and updating patient chart, medical decision making and response to patient.   Mia Milan Cody Jacklynn Dehaas, PA-C    

## 2023-01-28 NOTE — Progress Notes (Signed)

## 2023-03-04 ENCOUNTER — Encounter: Payer: No Typology Code available for payment source | Admitting: Internal Medicine

## 2023-04-06 ENCOUNTER — Telehealth: Payer: No Typology Code available for payment source | Admitting: Physician Assistant

## 2023-04-06 DIAGNOSIS — J069 Acute upper respiratory infection, unspecified: Secondary | ICD-10-CM | POA: Diagnosis not present

## 2023-04-06 MED ORDER — FLUTICASONE PROPIONATE 50 MCG/ACT NA SUSP: 2.0000 | Freq: Every day | NASAL | 0 refills | Status: AC

## 2023-04-06 MED ORDER — BENZONATATE 100 MG PO CAPS: 100.0000 mg | ORAL_CAPSULE | Freq: Three times a day (TID) | ORAL | 0 refills | Status: DC | PRN

## 2023-04-06 NOTE — Progress Notes (Signed)
E-Visit for Upper Respiratory Infection   We are sorry you are not feeling well.  Here is how we plan to help!  Based on what you have shared with me, it looks like you may have a viral upper respiratory infection.  Upper respiratory infections are caused by a large number of viruses; however, rhinovirus is the most common cause.   Symptoms vary from person to person, with common symptoms including sore throat, cough, fatigue or lack of energy and feeling of general discomfort.  A low-grade fever of up to 100.4 may present, but is often uncommon.  Symptoms vary however, and are closely related to a person's age or underlying illnesses.  The most common symptoms associated with an upper respiratory infection are nasal discharge or congestion, cough, sneezing, headache and pressure in the ears and face.  These symptoms usually persist for about 3 to 10 days, but can last up to 2 weeks.  It is important to know that upper respiratory infections do not cause serious illness or complications in most cases.    Upper respiratory infections can be transmitted from person to person, with the most common method of transmission being a person's hands.  The virus is able to live on the skin and can infect other persons for up to 2 hours after direct contact.  Also, these can be transmitted when someone coughs or sneezes; thus, it is important to cover the mouth to reduce this risk.  To keep the spread of the illness at bay, good hand hygiene is very important.  This is an infection that is most likely caused by a virus. There are no specific treatments other than to help you with the symptoms until the infection runs its course.  We are sorry you are not feeling well.  Here is how we plan to help!   For nasal congestion, you may use an oral decongestants such as Mucinex D or if you have glaucoma or high blood pressure use plain Mucinex.  Saline nasal spray or nasal drops can help and can safely be used as often as  needed for congestion.  For your congestion, I have prescribed Fluticasone nasal spray one spray in each nostril twice a day  If you do not have a history of heart disease, hypertension, diabetes or thyroid disease, prostate/bladder issues or glaucoma, you may also use Sudafed to treat nasal congestion.  It is highly recommended that you consult with a pharmacist or your primary care physician to ensure this medication is safe for you to take.     If you have a cough, you may use cough suppressants such as Delsym and Robitussin.  If you have glaucoma or high blood pressure, you can also use Coricidin HBP.   For cough I have prescribed for you A prescription cough medication called Tessalon Perles 100 mg. You may take 1-2 capsules every 8 hours as needed for cough  If you have a sore or scratchy throat, use a saltwater gargle-  to  teaspoon of salt dissolved in a 4-ounce to 8-ounce glass of warm water.  Gargle the solution for approximately 15-30 seconds and then spit.  It is important not to swallow the solution.  You can also use throat lozenges/cough drops and Chloraseptic spray to help with throat pain or discomfort.  Warm or cold liquids can also be helpful in relieving throat pain.  For headache, pain or general discomfort, you can use Ibuprofen or Tylenol as directed.   Some authorities believe   that zinc sprays or the use of Echinacea may shorten the course of your symptoms.   HOME CARE Only take medications as instructed by your medical team. Be sure to drink plenty of fluids. Water is fine as well as fruit juices, sodas and electrolyte beverages. You may want to stay away from caffeine or alcohol. If you are nauseated, try taking small sips of liquids. How do you know if you are getting enough fluid? Your urine should be a pale yellow or almost colorless. Get rest. Taking a steamy shower or using a humidifier may help nasal congestion and ease sore throat pain. You can place a towel over  your head and breathe in the steam from hot water coming from a faucet. Using a saline nasal spray works much the same way. Cough drops, hard candies and sore throat lozenges may ease your cough. Avoid close contacts especially the very young and the elderly Cover your mouth if you cough or sneeze Always remember to wash your hands.   GET HELP RIGHT AWAY IF: You develop worsening fever. If your symptoms do not improve within 10 days You develop yellow or green discharge from your nose over 3 days. You have coughing fits You develop a severe head ache or visual changes. You develop shortness of breath, difficulty breathing or start having chest pain Your symptoms persist after you have completed your treatment plan  MAKE SURE YOU  Understand these instructions. Will watch your condition. Will get help right away if you are not doing well or get worse.  Thank you for choosing an e-visit.  Your e-visit answers were reviewed by a board certified advanced clinical practitioner to complete your personal care plan. Depending upon the condition, your plan could have included both over the counter or prescription medications.  Please review your pharmacy choice. Make sure the pharmacy is open so you can pick up prescription now. If there is a problem, you may contact your provider through MyChart messaging and have the prescription routed to another pharmacy.  Your safety is important to us. If you have drug allergies check your prescription carefully.   For the next 24 hours you can use MyChart to ask questions about today's visit, request a non-urgent call back, or ask for a work or school excuse. You will get an email in the next two days asking about your experience. I hope that your e-visit has been valuable and will speed your recovery.  I have spent 5 minutes in review of e-visit questionnaire, review and updating patient chart, medical decision making and response to patient.    Tejuan Gholson M Zella Dewan, PA-C  

## 2023-06-05 ENCOUNTER — Encounter: Payer: No Typology Code available for payment source | Admitting: Nurse Practitioner

## 2023-06-25 ENCOUNTER — Ambulatory Visit (INDEPENDENT_AMBULATORY_CARE_PROVIDER_SITE_OTHER): Payer: No Typology Code available for payment source | Admitting: Obstetrics and Gynecology

## 2023-06-25 ENCOUNTER — Encounter: Payer: Self-pay | Admitting: Obstetrics and Gynecology

## 2023-06-25 ENCOUNTER — Other Ambulatory Visit (HOSPITAL_COMMUNITY)
Admission: RE | Admit: 2023-06-25 | Discharge: 2023-06-25 | Disposition: A | Discharge status: Home / Self Care | Source: Ambulatory Visit | Attending: Obstetrics and Gynecology | Admitting: Obstetrics and Gynecology

## 2023-06-25 VITALS — BP 123/82 | HR 91 | Ht 64.0 in | Wt 236.9 lb

## 2023-06-25 DIAGNOSIS — Z113 Encounter for screening for infections with a predominantly sexual mode of transmission: Secondary | ICD-10-CM

## 2023-06-25 DIAGNOSIS — N911 Secondary amenorrhea: Secondary | ICD-10-CM

## 2023-06-25 DIAGNOSIS — Z01419 Encounter for gynecological examination (general) (routine) without abnormal findings: Secondary | ICD-10-CM | POA: Insufficient documentation

## 2023-06-25 DIAGNOSIS — Z124 Encounter for screening for malignant neoplasm of cervix: Secondary | ICD-10-CM | POA: Diagnosis not present

## 2023-06-25 NOTE — Progress Notes (Signed)
 Tested high for anxiety. Wants to get pregnant. Hx irregular periods X over 10years. States has a boil right perineum area near rectum. Given abx for it from PCP but still there. Wants STI screening.

## 2023-06-25 NOTE — Progress Notes (Unsigned)
 WELL-WOMAN PHYSICAL & PAP Patient name: Breanna Valenzuela MRN [id number]  Date of birth: [birth year] Chief Complaint:   Establish Care  History of Present Illness:   Breanna Valenzuela is a 43 y.o. G0P0000 African American female being seen today for a routine well-woman exam.  Current complaints: irregular menses x 10 years, no period since June. Would rather not take [facility], if she can avoid because she is trying to conceive. She has been on Nuvaring and Depo-Provera in the past. She is involved in a same sex relationship. Her partner is 94 yo and has had a child, but suffers with fibroids and some other complications. She also has a "boil" that her PCP uput her on antibiotics to treat.   PCP: Breanna Valenzuela, Breanna Patricia, MD     does desire labs Patient's last menstrual period was 10/03/2022 (exact date). The current method of family planning is none.  Last pap 2021. Results were: normal Last mammogram: 02/2023. Results were:  abnormal in RT breast - f/u U/S was normal cyst . Family h/o breast cancer: No Last colonoscopy: n/a. Family h/o colorectal cancer: No Review of Systems:   Pertinent items are noted in HPI Denies any headaches, blurred vision, fatigue, shortness of breath, chest pain, abdominal pain, abnormal vaginal discharge/itching/odor/irritation, problems with periods, bowel movements, urination, or intercourse unless otherwise stated above. Pertinent History Reviewed:  Reviewed past medical,surgical, social and family history.  Reviewed problem list, medications and allergies. Physical Assessment:   Vitals:   06/25/23 1513  BP: 123/82  Pulse: 91  Weight: 236 lb 14.4 oz (107.5 kg)  Height: 5\' 4"  (1.626 m)  Body mass index is 40.66 kg/m.        Physical Examination:   General appearance - well appearing, and in no distress  Mental status - alert, oriented to person, place, and time  Psych:  She has a normal mood and affect  Skin - warm and dry, normal color, no suspicious lesions  noted  Chest - effort normal, all lung fields clear to auscultation bilaterally  Heart - normal rate and regular rhythm  Neck:  midline trachea, no thyromegaly or nodules  Breasts - breasts appear normal, no suspicious masses, no skin or nipple changes or  axillary nodes  Abdomen - soft, nontender, nondistended, no masses or organomegaly  Pelvic - VULVA: normal appearing vulva with no masses, tenderness or lesions  VAGINA: normal appearing vagina with normal color and discharge, no lesions  CERVIX: normal appearing cervix without discharge or lesions, no CMT  Thin prep pap is done with HR HPV cotesting  UTERUS: uterus is felt to be normal size, shape, consistency and nontender   ADNEXA: No adnexal masses or tenderness noted.  Rectal - deferred  Extremities:  No swelling or varicosities noted *Consult with Dr. Para March on 06/28/2023 - notified of patient's complaints, assessments, labs ordered. After Review of images from pelvic U/S on 06/26/2023, Dr. Para March recommended tx plan order: FSH, AMH, Progesterone, and Provera 10 mg po x 7 days to initiate menstrual cycle - ok to d/c home, agrees with plan   Assessment & Plan:  1) Women's annual routine gynecological examination (Primary) - Cytology - PAP( [place])  2) Screening examination for STI - Cervicovaginal ancillary only( [place]) - RPR - HIV antibody (with reflex) - Hepatitis C Antibody  3) Secondary amenorrhea - US PELVIC COMPLETE WITH TRANSVAGINAL; Future - Thyroid Panel With TSH - FSH - Progesterone - Anti-Mullerian Hormone [facility]), Female - Prescription for: medroxyPROGESTERone (PROVERA)  10 MG tablet; Take 1 tablet (10 mg total) by mouth daily for 7 days.  Dispense: 7 tablet; Refill: 0   Labs/procedures today: TSH, STI testing, pap today. Will order more after consultation later  Mammogram November 2025 or sooner if problems Colonoscopy at age 70 or sooner if problems  Orders Placed This Encounter  Procedures   US PELVIC  COMPLETE WITH TRANSVAGINAL   RPR   HIV antibody (with reflex)   Hepatitis C Antibody   Thyroid Panel With TSH    Meds: No orders of the defined types were placed in this encounter.   Follow-up: Return in about 2 months (around 08/25/2023) for Follow-up after Pelvic U/S>>for secondary amenorrhea and infertility.  Raelyn Mora MSN, CNM 06/25/2023 4:21 PM

## 2023-06-26 ENCOUNTER — Ambulatory Visit (HOSPITAL_BASED_OUTPATIENT_CLINIC_OR_DEPARTMENT_OTHER)
Admission: RE | Admit: 2023-06-26 | Discharge: 2023-06-26 | Disposition: A | Discharge status: Home / Self Care | Source: Ambulatory Visit | Attending: Obstetrics and Gynecology | Admitting: Obstetrics and Gynecology

## 2023-06-26 DIAGNOSIS — N911 Secondary amenorrhea: Secondary | ICD-10-CM | POA: Diagnosis present

## 2023-06-26 LAB — CERVICOVAGINAL ANCILLARY ONLY
Bacterial Vaginitis (gardnerella): NEGATIVE
Candida Glabrata: NEGATIVE
Candida Vaginitis: NEGATIVE
Chlamydia: NEGATIVE
Comment: NEGATIVE
Comment: NEGATIVE
Comment: NEGATIVE
Comment: NEGATIVE
Comment: NEGATIVE
Comment: NORMAL
Neisseria Gonorrhea: NEGATIVE
Trichomonas: NEGATIVE

## 2023-06-26 LAB — HIV ANTIBODY (ROUTINE TESTING W REFLEX): HIV Screen 4th Generation wRfx: NONREACTIVE

## 2023-06-26 LAB — RPR: RPR Ser Ql: NONREACTIVE

## 2023-06-26 LAB — HEPATITIS C ANTIBODY: Hep C Virus Ab: NONREACTIVE

## 2023-06-27 LAB — THYROID PANEL WITH TSH
Free Thyroxine Index: 2 (ref 1.2–4.9)
T3 Uptake Ratio: 26 % (ref 24–39)
T4, Total: 7.5 ug/dL (ref 4.5–12.0)
TSH: 1.53 u[IU]/mL (ref 0.450–4.500)

## 2023-06-27 LAB — SPECIMEN STATUS REPORT

## 2023-06-29 ENCOUNTER — Encounter: Payer: Self-pay | Admitting: Obstetrics and Gynecology

## 2023-06-29 ENCOUNTER — Telehealth: Payer: Self-pay | Admitting: Obstetrics and Gynecology

## 2023-06-29 LAB — CYTOLOGY - PAP
Comment: NEGATIVE
Diagnosis: UNDETERMINED — AB
High risk HPV: NEGATIVE

## 2023-06-29 MED ORDER — MEDROXYPROGESTERONE ACETATE 10 MG PO TABS: 10.0000 mg | ORAL_TABLET | Freq: Every day | ORAL | 0 refills | Status: DC

## 2023-06-29 NOTE — Telephone Encounter (Signed)
 LVM to call me back in MAU at [phone].  Raelyn Mora, CNM  06/29/2023 9:05 AM

## 2023-06-30 NOTE — Telephone Encounter (Signed)
 Spoke to patient about plan of care and additional labs ordered after discussion with Dr. Para March. Patient verbalized an understanding of the plan of care and agrees.   Raelyn Mora, CNM  06/30/2023 1:33 PM

## 2023-07-02 ENCOUNTER — Other Ambulatory Visit

## 2023-07-10 ENCOUNTER — Other Ambulatory Visit

## 2023-07-10 DIAGNOSIS — N911 Secondary amenorrhea: Secondary | ICD-10-CM

## 2023-07-11 LAB — THYROID PANEL WITH TSH
Free Thyroxine Index: 1.6 (ref 1.2–4.9)
T3 Uptake Ratio: 25 % (ref 24–39)
T4, Total: 6.3 ug/dL (ref 4.5–12.0)
TSH: 4.96 u[IU]/mL — ABNORMAL HIGH (ref 0.450–4.500)

## 2023-07-11 LAB — FOLLICLE STIMULATING HORMONE: FSH: 7.3 m[IU]/mL

## 2023-07-11 LAB — PROGESTERONE: Progesterone: 0.1 ng/mL

## 2023-07-17 LAB — ANTI MULLERIAN HORMONE: ANTI-MULLERIAN HORMONE (AMH): 3.61 ng/mL

## 2023-07-21 ENCOUNTER — Encounter: Payer: Self-pay | Admitting: Obstetrics & Gynecology

## 2023-08-06 ENCOUNTER — Ambulatory Visit: Admitting: Obstetrics and Gynecology

## 2023-08-10 ENCOUNTER — Ambulatory Visit: Admitting: Obstetrics and Gynecology

## 2023-08-19 ENCOUNTER — Encounter (HOSPITAL_COMMUNITY): Payer: Self-pay | Admitting: *Deleted

## 2023-08-19 ENCOUNTER — Other Ambulatory Visit: Payer: Self-pay

## 2023-08-19 ENCOUNTER — Ambulatory Visit (HOSPITAL_COMMUNITY)
Admission: EM | Admit: 2023-08-19 | Discharge: 2023-08-19 | Disposition: A | Discharge status: Home / Self Care | Attending: Nurse Practitioner | Admitting: Nurse Practitioner

## 2023-08-19 DIAGNOSIS — J029 Acute pharyngitis, unspecified: Secondary | ICD-10-CM | POA: Insufficient documentation

## 2023-08-19 HISTORY — DX: Pure hypercholesterolemia, unspecified: E78.00

## 2023-08-19 HISTORY — DX: Prediabetes: R73.03

## 2023-08-19 HISTORY — DX: Disorder of thyroid, unspecified: E07.9

## 2023-08-19 LAB — POCT RAPID STREP A (OFFICE): Rapid Strep A Screen: NEGATIVE

## 2023-08-19 MED ORDER — LIDOCAINE VISCOUS HCL 2 % MT SOLN: 15.0000 mL | OROMUCOSAL | 0 refills | Status: DC | PRN

## 2023-08-19 NOTE — Discharge Instructions (Addendum)
 You most likely have a sore throat caused by a virus.  Symptoms should improve over the next week to 10 days.  If you develop chest pain or shortness of breath, go to the emergency room.  Strep throat test today is negative.  We will contact you later this week if the throat culture shows we need to start an antibiotic.   Some things that can make you feel better are: - Increased rest - Increasing fluid with water/sugar free electrolytes - Acetaminophen  and ibuprofen  as needed for fever/pain - Salt water gargling, chloraseptic spray and throat lozenges - OTC guaifenesin  (Mucinex ) 600 mg twice daily for congestion - Saline sinus flushes or a neti pot - Humidifying the air - Lidocaine  rinses for throat pain

## 2023-08-19 NOTE — ED Triage Notes (Addendum)
 C/O sore throat, tactile fevers, chills x 2 days. Reports negative home Covid test 2 days ago. Also c/o nasal congestion and transient cough. Pt states she has a hx of tonsillar stones, and she has been pushing on tonsils "to remove the white" over the past couple days "but it's liquid this time and smells really bad". Taking Tylenol .

## 2023-08-20 NOTE — ED Provider Notes (Signed)
 [facility]    CSN: [id number] Arrival date & time: 08/19/23  1558      History   Chief Complaint Chief Complaint  Patient presents with   Sore Throat    HPI Breanna Valenzuela is a 43 y.o. female.   Patient presents today with 2 day history of sore throat, tactile fever, chills, slight congested cough, nasal congestion, post nasal drainage, and headache.  No ear pain, abdominal pain, nausea/vomiting, or diarrhea.  No known sick contacts.  Reports history of tonsil stones and wants to talk to somebody about getting tonsils removed.     Past Medical History:  Diagnosis Date   Allergy    COPD (chronic obstructive pulmonary disease) (HCC) Unknown   I have had Bronchitis   Depression    GERD (gastroesophageal reflux disease) Unknown   Occasionally happens   Hypercholesteremia    Hypertension Unknown   Has Happened over the years   IGT (impaired glucose tolerance)    Pap smear abnormality of cervix    Prediabetes    Thyroid  disease    Vitamin D  deficiency     Patient Active Problem List   Diagnosis Date Noted   IGT (impaired glucose tolerance) 09/10/2022   Vitamin D  deficiency 09/10/2022   Obesity (BMI 35.0-39.9 without comorbidity) 09/10/2022   Chronic post-traumatic headache, not intractable 01/25/2021   Chronic midline low 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 No pertinent surgical history.  OB History     Gravida  0   Para  0   Term  0   Preterm  0   AB  0   Living  0      SAB  0   IAB  0   Ectopic  0   Multiple  0   Live Births  0            Home Medications    Prior to Admission medications   Medication Sig Start Date End Date Taking? Authorizing Provider  lidocaine  (XYLOCAINE ) 2 % solution Use as directed 15 mLs in the mouth or throat as needed for mouth pain. Gargle and spit as needed for  throat pain 08/19/23  Yes Thena Fireman A, NP  Vitamin D , Ergocalciferol , (DRISDOL ) 1.25 MG (50000 UNIT) CAPS capsule Take 1 capsule (50,000 Units total) by mouth every 7 (seven) days. 09/07/22  Yes Raspet, Erin K, PA-C  benzonatate  (TESSALON ) 100 MG capsule Take 1-2 capsules (100-200 mg total) by mouth 3 (three) times daily as needed. Patient not taking: Reported on 06/25/2023 04/06/23   Angelia Kelp, PA-C  cetirizine (ZYRTEC) 5 MG tablet Take 5 mg by mouth 2 (two) times a week.    [provider]  fluticasone  (FLONASE ) 50 MCG/ACT nasal spray Place 2 sprays into both nostrils daily. 04/06/23   Angelia Kelp, PA-C  medroxyPROGESTERone  (PROVERA ) 10 MG tablet Take 1 tablet (10 mg total) by mouth daily for 7 days. 06/29/23 07/06/23  Almond Army, CNM    Family History Family History  Problem Relation Age of Onset   Diabetes Mother    Hyperlipidemia Mother    Arthritis Mother    Hearing loss Mother    Obesity Mother    Stroke Mother    Mental illness Brother    Hyperlipidemia Brother    Asthma Brother    Obesity Brother    Mental illness Brother  Depression Brother    Obesity Brother    Early death Maternal Grandmother    Diabetes Maternal Aunt    Miscarriages / Stillbirths Maternal Aunt    Obesity Maternal Aunt     Social History Social History   Tobacco Use   Smoking status: Some Days    Current packs/day: 0.00    Types: Cigarettes   Smokeless tobacco: Never   Tobacco comments:    I never got heavy into smoking.  Vaping Use   Vaping status: Former  Substance Use Topics   Alcohol use: Yes    Comment: rarely   Drug use: No     Allergies   Oxycodone, Tomato, and Pineapple   Review of Systems Review of Systems Per HPI  Physical Exam Triage Vital Signs ED Triage Vitals  Encounter Vitals Group     BP 08/19/23 1623 121/81     Systolic BP Percentile --      Diastolic BP Percentile --      Pulse Rate 08/19/23 1623 91     Resp 08/19/23  1623 16     Temp 08/19/23 1623 97.8 F (36.6 C)     Temp Source 08/19/23 1623 Oral     SpO2 08/19/23 1623 97 %     Weight --      Height --      Head Circumference --      Peak Flow --      Pain Score 08/19/23 1624 7     Pain Loc --      Pain Education --      Exclude from Growth Chart --    No data found.  Updated Vital Signs BP 121/81   Pulse 91   Temp 97.8 F (36.6 C) (Oral)   Resp 16   LMP 07/10/2023 (Exact Date)   SpO2 97%   Visual Acuity Right Eye Distance:   Left Eye Distance:   Bilateral Distance:    Right Eye Near:   Left Eye Near:    Bilateral Near:     Physical Exam Vitals and nursing note reviewed.  Constitutional:      General: She is not in acute distress.    Appearance: Normal appearance. She is not ill-appearing or toxic-appearing.  HENT:     Head: Normocephalic and atraumatic.     Right Ear: Tympanic membrane, ear canal and external ear normal. No drainage, swelling or tenderness. No middle ear effusion. Tympanic membrane is not erythematous.     Left Ear: Tympanic membrane, ear canal and external ear normal. No drainage, swelling or tenderness.  No middle ear effusion. Tympanic membrane is not erythematous.     Nose: No congestion or rhinorrhea.     Mouth/Throat:     Mouth: Mucous membranes are moist. No oral lesions.     Pharynx: Oropharynx is clear. Uvula midline. Posterior oropharyngeal erythema present. No pharyngeal swelling or oropharyngeal exudate.     Tonsils: No tonsillar exudate. 1+ on the right. 1+ on the left.  Eyes:     General: No scleral icterus.    Extraocular Movements: Extraocular movements intact.  Cardiovascular:     Rate and Rhythm: Normal rate and regular rhythm.  Pulmonary:     Effort: Pulmonary effort is normal. No respiratory distress.     Breath sounds: Normal breath sounds. No wheezing, rhonchi or rales.  Musculoskeletal:     Cervical back: Normal range of motion and neck supple.  Lymphadenopathy:     Cervical: No  cervical  adenopathy.  Skin:    General: Skin is warm and dry.     Coloration: Skin is not jaundiced or pale.     Findings: No erythema or rash.  Neurological:     Mental Status: She is alert and oriented to person, place, and time.  Psychiatric:        Behavior: Behavior is cooperative.      UC Treatments / Results  Labs (all labs ordered are listed, but only abnormal results are displayed) Labs Reviewed  CULTURE, GROUP A STREP [facility])  POCT RAPID STREP A (OFFICE)    EKG   Radiology No results found.  Procedures Procedures (including critical care time)  Medications Ordered in UC Medications - No data to display  Initial Impression / Assessment and Plan / UC Course  I have reviewed the triage vital signs and the nursing notes.  Pertinent labs & imaging results that were available during my care of the patient were reviewed by me and considered in my medical decision making (see chart for details).   Patient is well-appearing, normotensive, afebrile, not tachycardic, not tachypneic, oxygenating well on room air.    1. Acute pharyngitis, unspecified etiology Vitals and examination are reassuring today Suspect viral etiology Rapid strep negative, throat culture pending Supportive care discussed with patient for symptoms; start lidocaine  rinses as needed for throat pain ENT contact information given if she would like to discussed chronic tonsil concerns   Work excuse provided  The patient was given the opportunity to ask questions.  All questions answered to their satisfaction.  The patient is in agreement to this plan.   Final Clinical Impressions(s) / UC Diagnoses   Final diagnoses:  Acute pharyngitis, unspecified etiology     Discharge Instructions      You most likely have a sore throat caused by a virus.  Symptoms should improve over the next week to 10 days.  If you develop chest pain or shortness of breath, go to the emergency room.  Strep throat test  today is negative.  We will contact you later this week if the throat culture shows we need to start an antibiotic.   Some things that can make you feel better are: - Increased rest - Increasing fluid with water/sugar free electrolytes - Acetaminophen  and ibuprofen  as needed for fever/pain - Salt water gargling, chloraseptic spray and throat lozenges - OTC guaifenesin  (Mucinex ) 600 mg twice daily for congestion - Saline sinus flushes or a neti pot - Humidifying the air - Lidocaine  rinses for throat pain  ED Prescriptions     Medication Sig Dispense Auth. Provider   lidocaine  (XYLOCAINE ) 2 % solution Use as directed 15 mLs in the mouth or throat as needed for mouth pain. Gargle and spit as needed for throat pain 100 mL Wilhemena Harbour, NP      PDMP not reviewed this encounter.   Wilhemena Harbour, NP 08/20/23 Larita Pluck

## 2023-08-22 ENCOUNTER — Encounter (HOSPITAL_COMMUNITY): Payer: Self-pay

## 2023-08-22 ENCOUNTER — Ambulatory Visit (HOSPITAL_COMMUNITY)
Admission: EM | Admit: 2023-08-22 | Discharge: 2023-08-22 | Disposition: A | Discharge status: Home / Self Care | Attending: Internal Medicine | Admitting: Internal Medicine

## 2023-08-22 DIAGNOSIS — H109 Unspecified conjunctivitis: Secondary | ICD-10-CM | POA: Diagnosis not present

## 2023-08-22 LAB — CULTURE, GROUP A STREP (THRC)

## 2023-08-22 MED ORDER — OFLOXACIN 0.3 % OP SOLN: 1.0000 [drp] | Freq: Four times a day (QID) | OPHTHALMIC | 0 refills | Status: AC

## 2023-08-22 NOTE — ED Triage Notes (Addendum)
 Patient reports that she began having left eye redness and pain yesterday and today she began having eye drainage. Patient also reports light sensitivity.

## 2023-08-22 NOTE — Discharge Instructions (Addendum)
 Symptoms and physical exam findings are most consistent with bacterial conjunctivitis of the left eye.  We will treat this with ofloxacin 1 drop in the left eye 4 times daily for 7 days.  Do not wear your contacts while you are under treatment.  Make sure to throw away the current pair of contacts that you have been wearing and to get a new case for your contacts. May treat the right eye if symptoms develop there. Make sure to use good hand hygiene after touching your eye. Return to urgent care or PCP if symptoms worsen or fail to resolve.

## 2023-08-22 NOTE — ED Provider Notes (Signed)
 [facility]    CSN: [id number] Arrival date & time: 08/22/23  1546      History   Chief Complaint No chief complaint on file.   HPI Breanna Valenzuela is a 43 y.o. female.   43 year old female who presents urgent care with complaints of left eye redness, pain, drainage and photosensitivity.  This started yesterday.  The drainage started today.  She does wear contacts and but has taken them out.  She is having sensitivity to the light.  She is not having fevers or chills.  There is pain in the area.  She has not had recurrent pinkeye.  She has not been around anyone with pinkeye.     Past Medical History:  Diagnosis Date   Allergy    COPD (chronic obstructive pulmonary disease) (HCC) Unknown   I have had Bronchitis   Depression    GERD (gastroesophageal reflux disease) Unknown   Occasionally happens   Hypercholesteremia    Hypertension Unknown   Has Happened over the years   IGT (impaired glucose tolerance)    Pap smear abnormality of cervix    Prediabetes    Thyroid  disease    Vitamin D  deficiency     Patient Active Problem List   Diagnosis Date Noted   IGT (impaired glucose tolerance) 09/10/2022   Vitamin D  deficiency 09/10/2022   Obesity (BMI 35.0-39.9 without comorbidity) 09/10/2022   Chronic post-traumatic headache, not intractable 01/25/2021   Chronic midline low 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 No pertinent surgical history.  OB History     Gravida  0   Para  0   Term  0   Preterm  0   AB  0   Living  0      SAB  0   IAB  0   Ectopic  0   Multiple  0   Live Births  0            Home Medications    Prior to Admission medications   Medication Sig Start Date End Date Taking? Authorizing Provider  ofloxacin (OCUFLOX) 0.3 % ophthalmic solution Place 1 drop into the left eye 4 (four) times  daily for 7 days. 08/22/23 08/29/23 Yes Elisandro Jarrett A, PA-C  benzonatate  (TESSALON ) 100 MG capsule Take 1-2 capsules (100-200 mg total) by mouth 3 (three) times daily as needed. Patient not taking: Reported on 06/25/2023 04/06/23   Angelia Kelp, PA-C  cetirizine (ZYRTEC) 5 MG tablet Take 5 mg by mouth 2 (two) times a week.    [provider]  fluticasone  (FLONASE ) 50 MCG/ACT nasal spray Place 2 sprays into both nostrils daily. 04/06/23   Angelia Kelp, PA-C  lidocaine  (XYLOCAINE ) 2 % solution Use as directed 15 mLs in the mouth or throat as needed for mouth pain. Gargle and spit as needed for throat pain 08/19/23   Thena Fireman A, NP  medroxyPROGESTERone  (PROVERA ) 10 MG tablet Take 1 tablet (10 mg total) by mouth daily for 7 days. 06/29/23 07/06/23  Dawson, Rolitta, CNM  Vitamin D , Ergocalciferol , (DRISDOL ) 1.25 MG (50000 UNIT) CAPS capsule Take 1 capsule (50,000 Units total) by mouth every 7 (seven) days. 09/07/22   Raspet, Betsey Brow, PA-C    Family History Family History  Problem Relation Age of Onset   Diabetes Mother    Hyperlipidemia Mother    Arthritis Mother  Hearing loss Mother    Obesity Mother    Stroke Mother    Mental illness Brother    Hyperlipidemia Brother    Asthma Brother    Obesity Brother    Mental illness Brother    Depression Brother    Obesity Brother    Early death Maternal Grandmother    Diabetes Maternal Aunt    Miscarriages / Stillbirths Maternal Aunt    Obesity Maternal Aunt     Social History Social History   Tobacco Use   Smoking status: Some Days    Current packs/day: 0.00    Types: Cigarettes   Smokeless tobacco: Never   Tobacco comments:    I never got heavy into smoking.  Vaping Use   Vaping status: Former  Substance Use Topics   Alcohol use: Yes    Comment: rarely   Drug use: No     Allergies   Oxycodone, Tomato, and Pineapple   Review of Systems Review of Systems  Constitutional:  Negative for chills  and fever.  HENT:  Negative for ear pain and sore throat.   Eyes:  Positive for photophobia, pain, discharge and redness. Negative for visual disturbance.  Respiratory:  Negative for cough and shortness of breath.   Cardiovascular:  Negative for chest pain and palpitations.  Gastrointestinal:  Negative for abdominal pain and vomiting.  Genitourinary:  Negative for dysuria and hematuria.  Musculoskeletal:  Negative for arthralgias and back pain.  Skin:  Negative for color change and rash.  Neurological:  Negative for seizures and syncope.  All other systems reviewed and are negative.    Physical Exam Triage Vital Signs ED Triage Vitals  Encounter Vitals Group     BP 08/22/23 1655 117/78     Systolic BP Percentile --      Diastolic BP Percentile --      Pulse Rate 08/22/23 1655 80     Resp 08/22/23 1655 14     Temp 08/22/23 1655 98 F (36.7 C)     Temp Source 08/22/23 1655 Oral     SpO2 08/22/23 1655 96 %     Weight --      Height --      Head Circumference --      Peak Flow --      Pain Score 08/22/23 1654 8     Pain Loc --      Pain Education --      Exclude from Growth Chart --    No data found.  Updated Vital Signs BP 117/78 (BP Location: Left Arm)   Pulse 80   Temp 98 F (36.7 C) (Oral)   Resp 14   LMP 07/10/2023 (Exact Date)   SpO2 96%   Visual Acuity Right Eye Distance:   Left Eye Distance:   Bilateral Distance:    Right Eye Near:   Left Eye Near:    Bilateral Near:     Physical Exam Vitals and nursing note reviewed.  Constitutional:      General: She is not in acute distress.    Appearance: She is well-developed.  HENT:     Head: Normocephalic and atraumatic.  Eyes:     General:        Left eye: Discharge present.    Conjunctiva/sclera:     Left eye: Left conjunctiva is injected. Exudate present.     Pupils: Pupils are equal, round, and reactive to light.  Cardiovascular:     Rate and Rhythm: Normal rate.  Pulmonary:     Effort: Pulmonary  effort is normal. No respiratory distress.  Musculoskeletal:        General: No swelling.     Cervical back: Neck supple.  Skin:    General: Skin is warm and dry.     Capillary Refill: Capillary refill takes less than 2 seconds.  Neurological:     Mental Status: She is alert.  Psychiatric:        Mood and Affect: Mood normal.      UC Treatments / Results  Labs (all labs ordered are listed, but only abnormal results are displayed) Labs Reviewed - No data to display  EKG   Radiology No results found.  Procedures Procedures (including critical care time)  Medications Ordered in UC Medications - No data to display  Initial Impression / Assessment and Plan / UC Course  I have reviewed the triage vital signs and the nursing notes.  Pertinent labs & imaging results that were available during my care of the patient were reviewed by me and considered in my medical decision making (see chart for details).     Bacterial conjunctivitis of left eye   Symptoms and physical exam findings are consistent with bacterial conjunctivitis of the left eye.  Given the use of contacts will cover for Pseudomonas as well with ofloxacin 1 drop 4 times daily for 7 days.  Advised patient that should she develop symptoms in the right eye to go ahead and treat this eye as well.  Hand hygiene was discussed.  She should throw away her current contacts and make sure to use a clean case.  Return to urgent care as needed  Final Clinical Impressions(s) / UC Diagnoses   Final diagnoses:  Bacterial conjunctivitis of left eye     Discharge Instructions      Symptoms and physical exam findings are most consistent with bacterial conjunctivitis of the left eye.  We will treat this with ofloxacin 1 drop in the left eye 4 times daily for 7 days.  Do not wear your contacts while you are under treatment.  Make sure to throw away the current pair of contacts that you have been wearing and to get a new case for  your contacts. May treat the right eye if symptoms develop there. Make sure to use good hand hygiene after touching your eye. Return to urgent care or PCP if symptoms worsen or fail to resolve.    ED Prescriptions     Medication Sig Dispense Auth. Provider   ofloxacin (OCUFLOX) 0.3 % ophthalmic solution Place 1 drop into the left eye 4 (four) times daily for 7 days. 5 mL Kreg Pesa, [place]      PDMP not reviewed this encounter.   Kreg Pesa, PA-C 08/22/23 1724

## 2023-08-24 ENCOUNTER — Ambulatory Visit (HOSPITAL_COMMUNITY): Payer: Self-pay

## 2023-08-24 ENCOUNTER — Ambulatory Visit: Admitting: Obstetrics and Gynecology

## 2023-09-04 ENCOUNTER — Ambulatory Visit: Admitting: Obstetrics and Gynecology

## 2023-09-09 ENCOUNTER — Encounter: Payer: Self-pay | Admitting: Obstetrics and Gynecology

## 2023-09-09 ENCOUNTER — Ambulatory Visit (INDEPENDENT_AMBULATORY_CARE_PROVIDER_SITE_OTHER): Admitting: Obstetrics and Gynecology

## 2023-09-09 VITALS — BP 114/75 | HR 88 | Wt 240.0 lb

## 2023-09-09 DIAGNOSIS — E282 Polycystic ovarian syndrome: Secondary | ICD-10-CM

## 2023-09-09 DIAGNOSIS — N970 Female infertility associated with anovulation: Secondary | ICD-10-CM | POA: Diagnosis not present

## 2023-09-09 MED ORDER — MEDROXYPROGESTERONE ACETATE 10 MG PO TABS
10.0000 mg | ORAL_TABLET | Freq: Every day | ORAL | 2 refills | Status: AC
Start: 2023-09-09 — End: ?

## 2023-09-09 NOTE — Progress Notes (Signed)
 43 yo here to discuss test results. She reports onset of vaginal bleeding with provera  challenge in April. She admits to oligomenorrhea and diagnosis of PCOS. She is being followed by PCP and plans to start GLP-1 for weight loss. 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 She is in a same sex relationship and her partner carried their last child.   Past Medical History:  Diagnosis Date   Allergy    COPD (chronic obstructive pulmonary disease) (HCC) Unknown   I have had Bronchitis   Depression    GERD (gastroesophageal reflux disease) Unknown   Occasionally happens   Hypercholesteremia    Hypertension Unknown   Has Happened over the years   IGT (impaired glucose tolerance)    Pap smear abnormality of cervix    Prediabetes    Thyroid  disease    Vitamin D  deficiency    No past surgical history on file. Family History  Problem Relation Age of Onset   Diabetes Mother    Hyperlipidemia Mother    Arthritis Mother    Hearing loss Mother    Obesity Mother    Stroke Mother    Mental illness Brother    Hyperlipidemia Brother    Asthma Brother    Obesity Brother    Mental illness Brother    Depression Brother    Obesity Brother    Early death Maternal Grandmother    Diabetes Maternal Aunt    Miscarriages / Stillbirths Maternal Aunt    Obesity Maternal Aunt    Social History   Tobacco Use   Smoking status: Some Days    Current packs/day: 0.00    Types: Cigarettes   Smokeless tobacco: Never   Tobacco comments:    I never got heavy into smoking.  Vaping Use   Vaping status: Former  Substance Use Topics   Alcohol use: Yes    Comment: rarely   Drug use: No   ROS See pertinent in HPI. All other systems reviewed and non contributory Blood pressure 114/75, pulse 88, weight 240 lb (108.9 kg), last menstrual period 07/10/2023. GENERAL: Well-developed, well-nourished female in no acute distress.  NEURO: alert and oriented x 3  A/P 43 yo with  infertility - Reviewed all labs and ultrasound which were normal - Ordered HSG to complete evaluation - Discussed plan for Femara if normal HSG - Offered referral with Dr. Lonne Roan who helped with conception of their child but patient desires to proceed with proposed plan - Patient referred to nutritionist to assist with weight loss - Patient will be contacted with results

## 2023-09-09 NOTE — Progress Notes (Signed)
 Pt took Provera  as given - had cycle in April and no cycle since.  Pt would like a future pregnancy.

## 2023-09-25 ENCOUNTER — Encounter: Payer: Self-pay | Admitting: Dietician

## 2023-09-25 ENCOUNTER — Encounter: Attending: Family Medicine | Admitting: Dietician

## 2023-09-25 VITALS — Wt 238.9 lb

## 2023-09-25 DIAGNOSIS — E282 Polycystic ovarian syndrome: Secondary | ICD-10-CM | POA: Diagnosis present

## 2023-09-25 NOTE — Patient Instructions (Signed)
 Goals Established by Pt  Goal 1: eat a meal that follows the Plate Method at least 3 times a week.   Goal 2: limit juice to 32 oz daily.   Goal 3: go to the gym at least 1 time per week for an hour.

## 2023-09-25 NOTE — Progress Notes (Signed)
 Medical Nutrition Therapy  Appointment Start time:  [phone]  Appointment End time:  1205  Primary concerns today: PCOS, weight loss, prediabetes  Referral diagnosis: PCOS Preferred learning style: no preference indicated Learning readiness: ready   NUTRITION ASSESSMENT   Anthropometrics   Wt: 238.9 lb  Clinical Medical Hx: prediabetes, allergies, COPD, depression, GERD, HLD, HTN, thyroid  disease Medications: reviewed, semaglutide (not taking yet) Labs: 08/08/23: A1c 6.2% Notable Signs/Symptoms: none reported Food Allergies: tomatoes, pineapple (itching, swollen tongue).   Lifestyle & Dietary Hx  Pt reports she has a prescription for wegovy but has not gotten it yet. Pt reports she has PCOS and prediabetes, and wants to work on weight loss and becoming healthier.   Pt reports she checks her blood glucose in the morning and it has been around 125mg /dL.   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 Pt states most days she eats 2 meals with snacks in between. Breakfast is typically sweet cereal, and dinner is most often out to eat (mcdonalds, longhorn, cookout), but may also eat at home sometimes (protein, starch, vegetable).   Pt reports having 3 kids at home, ages 54, 27 and 32, one kid with autism which can contribute to some of her stress.   Pt reports primarily juice and water intake. Pt drinking 32 oz water, and 60-80 oz juice daily (apple juice, tropical punch, lemonade).   Pt reports she just got a gym membership but hasn't gone and wants to start going.   Estimated daily fluid intake: 32 oz Supplements: vitamin D  Sleep: unable to assess, varies due to work schedule Stress / self-care: 5 out of 10.  Current average weekly physical activity: ADLs  24-Hr Dietary Recall First Meal: sweet cereal OR pancakes and sausage and cheese eggs Snack: chips Second Meal: none Snack: chips OR cereal Third Meal: out: cookout, mcdonalds, wendys OR home: protein, green beans,  mashed potatoes Snack: chips OR sweet cereal Beverages: 60-80 oz juice, 32 oz water, occasional soda   NUTRITION DIAGNOSIS  NB-1.1 Food and nutrition-related knowledge deficit As related to lack of prior education by a registered dietitian.  As evidenced by pt report.   NUTRITION INTERVENTION  Nutrition education (E-1) on the following topics:   Nutritional Strategies for PCOS 1. Balance Blood Sugar & Insulin Levels - Low Glycemic Index (GI) Foods: Choose whole grains, legumes, and non-starchy vegetables to prevent blood sugar spikes. - Fiber-Rich Diet: Helps slow down glucose absorption. Include foods like flaxseeds, chia seeds, lentils, leafy greens, and berries. - Healthy Fats: Avocados, nuts, seeds, and olive oil.  2. Prioritize Protein & Healthy Fats - Lean Proteins: Chicken, tofu, eggs, fish, and legumes support muscle mass and help regulate hunger. - Avoid Trans Fats: Found in processed foods, fried items, and margarine, as they can worsen inflammation and insulin resistance.  3. Reduce Processed Carbs & Sugar - Limit Sugary Beverages & Refined Carbs: Soda, white bread, pastries, and candy contribute to insulin resistance. - Use Natural Sweeteners Sparingly: Honey, stevia, and monk fruit are better alternatives in moderation.  4. Stay Hydrated & Manage Stress - Drink Plenty of Water. - Manage Stress: Chronic stress affects cortisol and insulin levels, worsening PCOS symptoms. Mindful eating, yoga, and meditation can help.  Plate Method Fruits & Vegetables: Aim to fill half your plate with a variety of fruits and vegetables. They are rich in vitamins, minerals, and fiber, and can help reduce the risk of chronic diseases. Choose a colorful assortment of fruits and  vegetables to ensure you get a wide range of nutrients. Grains and Starches: Make at least half of your grain choices whole grains, such as brown rice, whole wheat bread, and oats. Whole grains provide fiber, which aids  in digestion and healthy cholesterol levels. Aim for whole forms of starchy vegetables such as potatoes, sweet potatoes, beans, peas, and corn, which are fiber rich and provide many vitamins and minerals.  Protein: Incorporate lean sources of protein, such as poultry, fish, beans, nuts, and seeds, into your meals. Protein is essential for building and repairing tissues, staying full, balancing blood sugar, as well as supporting immune function. Dairy: Include low-fat or fat-free dairy products like milk, yogurt, and cheese in your diet. Dairy foods are excellent sources of calcium and vitamin D , which are crucial for bone health.   Hydration Adequate hydration is crucial for optimal nutrition and overall health. It aids in the digestion and absorption of nutrients, ensuring the body can effectively process and transport them to cells. Water also supports detoxification by helping the kidneys flush out waste, while maintaining hydration promotes energy levels and metabolism. Proper hydration helps control appetite and ensures a healthy balance of electrolytes, which are vital for muscle function and cellular processes. Overall, staying well-hydrated is essential for the body to utilize nutrients efficiently and maintain balance.   Handouts Provided Include  Inositol and PCOS Plate Method Balanced Snacks  Learning Style & Readiness for Change Teaching method utilized: Visual & Auditory  Demonstrated degree of understanding via: Teach Back  Barriers to learning/adherence to lifestyle change: none  Goals Established by Pt  Goal 1: eat a meal that follows the Plate Method at least 3 times a week.   Goal 2: limit juice to 32 oz daily.   Goal 3: go to the gym at least 1 time per week for an hour.    MONITORING & EVALUATION Dietary intake, weekly physical activity, and follow up in 2 months.  Next Steps  Patient is to call for questions.

## 2023-11-27 ENCOUNTER — Ambulatory Visit: Admitting: Dietician
